# Patient Record
Sex: Female | Born: 1980 | Hispanic: No | Marital: Married | Smoking: Never smoker
Health system: Southern US, Community
[De-identification: ages and names within clinical notes are randomized; demographics above are authoritative.]

## PROBLEM LIST (undated history)

## (undated) DIAGNOSIS — F419 Anxiety disorder, unspecified: Secondary | ICD-10-CM

## (undated) DIAGNOSIS — F32A Depression, unspecified: Secondary | ICD-10-CM

## (undated) DIAGNOSIS — D649 Anemia, unspecified: Secondary | ICD-10-CM

## (undated) DIAGNOSIS — T148XXA Other injury of unspecified body region, initial encounter: Secondary | ICD-10-CM

## (undated) DIAGNOSIS — F329 Major depressive disorder, single episode, unspecified: Secondary | ICD-10-CM

## (undated) HISTORY — PX: HYSTEROSCOPY WITH D & C: SHX1775

## (undated) HISTORY — PX: DIAGNOSTIC LAPAROSCOPY: SUR761

## (undated) HISTORY — PX: INTRAUTERINE DEVICE (IUD) INSERTION: SHX5877

## (undated) HISTORY — PX: DILATION AND CURETTAGE OF UTERUS: SHX78

## (undated) HISTORY — PX: IUD REMOVAL: SHX5392

---

## 2016-10-02 NOTE — H&P (Signed)
35yo G0 who presents for Total laparoscopic hysterectomy, bilateral salpingectomy due to abnormal uterine bleeding.  This has been an ongoing issue for over several years with no improvement despite conservative management.  Part of her issue, is that she has moved several times, so in each city a work up is completed.  Per patient, she was on pills from 2008-2012 and ultimately was switched to a Skyla due to continued irregular bleeding. The Isla Pence was placed about a year ago, initially things were ok up until about April. Additionally, in 2013 she underwent a diagnostic laparoscopy- no abnormal findings. Pt reports inconsistent irregular bleeding daily. Sometimes it will just be spotting, other times, when it seems like it is her menses she will have moderate to heavy bleeding. She usually wears 2-3 pads per day and changes them 2x per day. She has had a hematology work up and everything has been negative. Her bleeding has been unchanged since establishing care with me in October. At this point, she has failed conservative management, does not desire a future pregnancy and wishes to proceed with surgical management of her bleeding.      ROS:  CONSTITUTIONAL:  no Chills. no Fever. no Night sweats.  HEENT:  Blurrred vision no. no Double vision.  CARDIOLOGY:  no Chest pain.  RESPIRATORY:  no Shortness of breath. no Cough.  UROLOGY:  no Urinary frequency. no Urinary incontinence. no Urinary urgency.  GASTROENTEROLOGY:  no Abdominal pain. no Appetite change. no Change in bowel movements.  FEMALE REPRODUCTIVE:  no Breast lumps or discharge. no Breast pain.  NEUROLOGY:  no Dizziness. no Headache. no Loss of consciousness.  PSYCHOLOGY:  Anxiety yes. Depression yes.  SKIN:  no Rash. no Hives.  HEMATOLOGY/LYMPH:  no Anemia. Fatigue yes. Using Blood Thinners no.         Medical History: Mood disorder        Gyn History:  Sexual activity not currently sexually active.  Periods : irregular.   LMP currently.  Birth control Sklya IUD plkaced 06/29/15.  Last pap smear date 2016 - WNL.  Denies H/O Last mammogram date.  Denies H/O Abnormal pap smear.  Denies H/O STD.  Menarche 35.        OB History:  Never been pregnant per patient.        Surgical History: IUD placement, GYN in [place] surgery 2013.        Family History: Father: alive, diabetes, Parkinsons. Mother: deceased, cancer of liver, diabetes. Paternal [place] Father: deceased, diabetes. Paternal [place] Mother: deceased, diabetes. Maternal Grand Father: deceased, diabetes. Maternal Grand Mother: deceased, diabetes. Sister 1: alive. 1 sister(s) . Marland Kitchen        Social History:  General:  Tobacco use  cigarettes: Never smoked Tobacco history last updated 09/17/2016 no EXPOSURE TO PASSIVE SMOKE.  Alcohol: yes, wine, 2 per week for last 10 years as of 07/03/16 OV.  no Recreational drug use.  Marital Status: married.  Children: none.  EDUCATION: Secretary/administrator, Graduate school 2004, 2012.  OCCUPATION: unemployed.  COMMUNICATION BARRIERS: none.  no Smoking.        Medications:  Lamictal(LamoTRIgine) 200 MG Tablet Orally once a day,  BuSpar(BusPIRone HCl) 5 mg 1 tablet Orally three times a day,  Vitamin D 2000 UNIT Tablet 1 tablet Orally Once a day,  Fluticasone Propionate 50 MCG/ACT Suspension 1 spray in each nostril Nasally Once a day, Skyla(Levonorgestrel) 13.5 MG Intrauterine Device Intrauterine ,  Venlafaxine HCl ER 75 MG Capsule Extended Release 24 Hour 1 capsule  with food Orally       Allergies: Floxacins: rash/dizziness: Side Effects, Penicllin: rash: Side Effects, Sulfa: rash: Side Effects, Codeine: heart palpitations: Side Effects.        O: Performed in office: GENERAL APPEARANCE well developed, well nourished .  SKIN: warm and dry, no rashes .  NECK: supple, normal appearance .  LUNGS: regular breathing rate and effort .  HEART: regular rate and rhythm.  ABDOMEN: soft and not tender, no masses palpated, no  rebound, no rigidity.  FEMALE GENITOURINARY: normal external genitalia, labia - unremarkable, vagina - pink moist mucosa, no lesions, ~20cc of dark red blood noted in vault consistent with irregular bleeding, cervix - no discharge or lesions or CMT, strings visualized, adnexa - no masses or tenderness, uterus - nontender and normal size on palpation.  MUSCULOSKELETAL no calf tenderness bilaterally .  EXTREMITIES: no edema present .  PSYCH: appropriate mood and affect .   Labs/Imaging: 10/25 TVUS:  8.8 cm uterus with 2 small fibroids- anterior fundal 4.5cm, submucosal 1.4cm 10/23 EMB: Negative for atypia, hyperplasia or malignancy  A/P: 35yo G0 who presents for TLH, BS due to AUB -NPO -LR @ 125cc/hr -SCDs to OR -Risk/benefit and potential complications reviewed with patient including risk of bleeding, infection, injury and potential need for open surgical management.  Questions and concerns were addressed and pt wishes to proceed.  Janyth Pupa, DO [phone] (pager) [phone] (office)

## 2016-10-04 NOTE — Patient Instructions (Addendum)
Your procedure is scheduled on:  Wednesday, Dec. 27, 2017  Enter through the [employer] [facility] at:  12:15 PM  Pick up the phone at the desk and dial [phone].  Call this number if you have problems the morning of surgery: [phone].  Remember: Do NOT eat food:  After Midnight Tuesday  Do NOT drink clear liquids after:  9:30 AM day of surgery  Take these medicines the morning of surgery with a SIP OF WATER:  Lamictal, Buspar, Aplenzin, Venlafaxine  Stop ALL herbal medications at this time   Do NOT wear jewelry (body piercing), metal hair clips/bobby pins, make-up, or nail polish. Do NOT wear lotions, powders, or perfumes.  You may wear deodorant. Do NOT shave for 48 hours prior to surgery. Do NOT bring valuables to the hospital. Contacts, dentures, or bridgework may not be worn into surgery.  Leave suitcase in car.  After surgery it may be brought to your room.  For patients admitted to the hospital, checkout time is 11:00 AM the day of discharge.

## 2016-10-08 ENCOUNTER — Encounter (HOSPITAL_COMMUNITY)
Admission: RE | Admit: 2016-10-08 | Discharge: 2016-10-08 | Disposition: A | Discharge status: Home / Self Care | Payer: BLUE CROSS/BLUE SHIELD | Source: Ambulatory Visit | Attending: Obstetrics & Gynecology | Admitting: Obstetrics & Gynecology

## 2016-10-08 ENCOUNTER — Encounter (HOSPITAL_COMMUNITY): Payer: Self-pay

## 2016-10-08 DIAGNOSIS — N938 Other specified abnormal uterine and vaginal bleeding: Secondary | ICD-10-CM | POA: Diagnosis not present

## 2016-10-08 DIAGNOSIS — Z880 Allergy status to penicillin: Secondary | ICD-10-CM | POA: Diagnosis not present

## 2016-10-08 DIAGNOSIS — F329 Major depressive disorder, single episode, unspecified: Secondary | ICD-10-CM | POA: Diagnosis not present

## 2016-10-08 DIAGNOSIS — F419 Anxiety disorder, unspecified: Secondary | ICD-10-CM | POA: Diagnosis not present

## 2016-10-08 DIAGNOSIS — D251 Intramural leiomyoma of uterus: Secondary | ICD-10-CM | POA: Diagnosis not present

## 2016-10-08 DIAGNOSIS — Z888 Allergy status to other drugs, medicaments and biological substances status: Secondary | ICD-10-CM | POA: Diagnosis not present

## 2016-10-08 DIAGNOSIS — Z885 Allergy status to narcotic agent status: Secondary | ICD-10-CM | POA: Diagnosis not present

## 2016-10-08 DIAGNOSIS — N800 Endometriosis of uterus: Secondary | ICD-10-CM | POA: Diagnosis not present

## 2016-10-08 DIAGNOSIS — N72 Inflammatory disease of cervix uteri: Secondary | ICD-10-CM | POA: Diagnosis not present

## 2016-10-08 DIAGNOSIS — Z882 Allergy status to sulfonamides status: Secondary | ICD-10-CM | POA: Diagnosis not present

## 2016-10-08 HISTORY — DX: Depression, unspecified: F32.A

## 2016-10-08 HISTORY — DX: Anemia, unspecified: D64.9

## 2016-10-08 HISTORY — DX: Other injury of unspecified body region, initial encounter: T14.8XXA

## 2016-10-08 HISTORY — DX: Major depressive disorder, single episode, unspecified: F32.9

## 2016-10-08 HISTORY — DX: Anxiety disorder, unspecified: F41.9

## 2016-10-08 LAB — CBC
HCT: 39.7 % (ref 36.0–46.0)
Hemoglobin: 13.2 g/dL (ref 12.0–15.0)
MCH: 29.1 pg (ref 26.0–34.0)
MCHC: 33.2 g/dL (ref 30.0–36.0)
MCV: 87.4 fL (ref 78.0–100.0)
PLATELETS: 309 10*3/uL (ref 150–400)
RBC: 4.54 MIL/uL (ref 3.87–5.11)
RDW: 13.8 % (ref 11.5–15.5)
WBC: 6.3 10*3/uL (ref 4.0–10.5)

## 2016-10-08 LAB — BASIC METABOLIC PANEL
Anion gap: 8 (ref 5–15)
BUN: 8 mg/dL (ref 6–20)
CALCIUM: 9 mg/dL (ref 8.9–10.3)
CHLORIDE: 106 mmol/L (ref 101–111)
CO2: 24 mmol/L (ref 22–32)
CREATININE: 0.62 mg/dL (ref 0.44–1.00)
GFR calc non Af Amer: >60 mL/min (ref >60)
Glucose, Bld: 89 mg/dL (ref 65–99)
Potassium: 4.2 mmol/L (ref 3.5–5.1)
SODIUM: 138 mmol/L (ref 135–145)

## 2016-10-08 LAB — TYPE AND SCREEN
ABO/RH(D): AB POS
ANTIBODY SCREEN: NEGATIVE

## 2016-10-08 LAB — ABO/RH: ABO/RH(D): AB POS

## 2016-10-09 ENCOUNTER — Encounter (HOSPITAL_COMMUNITY)
Admission: RE | Disposition: A | Discharge status: Home / Self Care | Payer: Self-pay | Source: Ambulatory Visit | Attending: Obstetrics & Gynecology

## 2016-10-09 ENCOUNTER — Observation Stay (HOSPITAL_COMMUNITY)
Admission: RE | Admit: 2016-10-09 | Discharge: 2016-10-10 | Disposition: A | Discharge status: Home / Self Care | Payer: BLUE CROSS/BLUE SHIELD | Source: Ambulatory Visit | Attending: Obstetrics & Gynecology | Admitting: Obstetrics & Gynecology

## 2016-10-09 ENCOUNTER — Ambulatory Visit (HOSPITAL_COMMUNITY): Payer: BLUE CROSS/BLUE SHIELD | Admitting: Registered Nurse

## 2016-10-09 ENCOUNTER — Encounter (HOSPITAL_COMMUNITY): Payer: Self-pay | Admitting: Emergency Medicine

## 2016-10-09 DIAGNOSIS — N800 Endometriosis of uterus: Secondary | ICD-10-CM | POA: Insufficient documentation

## 2016-10-09 DIAGNOSIS — Z880 Allergy status to penicillin: Secondary | ICD-10-CM | POA: Insufficient documentation

## 2016-10-09 DIAGNOSIS — F419 Anxiety disorder, unspecified: Secondary | ICD-10-CM | POA: Insufficient documentation

## 2016-10-09 DIAGNOSIS — Z888 Allergy status to other drugs, medicaments and biological substances status: Secondary | ICD-10-CM | POA: Insufficient documentation

## 2016-10-09 DIAGNOSIS — N938 Other specified abnormal uterine and vaginal bleeding: Secondary | ICD-10-CM | POA: Diagnosis not present

## 2016-10-09 DIAGNOSIS — D251 Intramural leiomyoma of uterus: Secondary | ICD-10-CM | POA: Insufficient documentation

## 2016-10-09 DIAGNOSIS — Z885 Allergy status to narcotic agent status: Secondary | ICD-10-CM | POA: Insufficient documentation

## 2016-10-09 DIAGNOSIS — F329 Major depressive disorder, single episode, unspecified: Secondary | ICD-10-CM | POA: Insufficient documentation

## 2016-10-09 DIAGNOSIS — Z882 Allergy status to sulfonamides status: Secondary | ICD-10-CM | POA: Insufficient documentation

## 2016-10-09 DIAGNOSIS — N72 Inflammatory disease of cervix uteri: Secondary | ICD-10-CM | POA: Insufficient documentation

## 2016-10-09 DIAGNOSIS — N939 Abnormal uterine and vaginal bleeding, unspecified: Secondary | ICD-10-CM | POA: Diagnosis present

## 2016-10-09 LAB — PREGNANCY, URINE: PREG TEST UR: NEGATIVE

## 2016-10-09 SURGERY — HYSTERECTOMY, TOTAL, LAPAROSCOPIC, WITH SALPINGECTOMY
Anesthesia: General | Laterality: Bilateral

## 2016-10-09 MED ORDER — GENTAMICIN SULFATE 40 MG/ML IJ SOLN
INTRAMUSCULAR | Status: AC
  Administered 2016-10-09: 100 mL via INTRAVENOUS
  Filled 2016-10-09: qty 7.25

## 2016-10-09 MED ORDER — ONDANSETRON HCL 4 MG/2ML IJ SOLN
INTRAMUSCULAR | Status: DC | PRN
  Administered 2016-10-09: 4 mg via INTRAVENOUS

## 2016-10-09 MED ORDER — SCOPOLAMINE 1 MG/3DAYS TD PT72
MEDICATED_PATCH | TRANSDERMAL | Status: AC
  Administered 2016-10-09: 1.5 mg via TRANSDERMAL
  Filled 2016-10-09: qty 1

## 2016-10-09 MED ORDER — DEXAMETHASONE SODIUM PHOSPHATE 4 MG/ML IJ SOLN
INTRAMUSCULAR | Status: AC
  Filled 2016-10-09: qty 1

## 2016-10-09 MED ORDER — ALUM & MAG HYDROXIDE-SIMETH 200-200-20 MG/5ML PO SUSP: 30.0000 mL | ORAL | Status: DC | PRN

## 2016-10-09 MED ORDER — PROPOFOL 10 MG/ML IV BOLUS
INTRAVENOUS | Status: DC | PRN
  Administered 2016-10-09: 120 mg via INTRAVENOUS

## 2016-10-09 MED ORDER — BUPROPION HCL ER (XL) 150 MG PO TB24
150.0000 mg | ORAL_TABLET | Freq: Every day | ORAL | Status: DC
  Filled 2016-10-09: qty 1

## 2016-10-09 MED ORDER — MENTHOL 3 MG MT LOZG: 1.0000 | LOZENGE | OROMUCOSAL | Status: DC | PRN

## 2016-10-09 MED ORDER — MIDAZOLAM HCL 2 MG/2ML IJ SOLN
INTRAMUSCULAR | Status: DC | PRN
  Administered 2016-10-09: 2 mg via INTRAVENOUS

## 2016-10-09 MED ORDER — PROMETHAZINE HCL 25 MG/ML IJ SOLN: 6.2500 mg | INTRAMUSCULAR | Status: DC | PRN

## 2016-10-09 MED ORDER — PHENYLEPHRINE HCL 10 MG/ML IJ SOLN
INTRAMUSCULAR | Status: DC | PRN
  Administered 2016-10-09 (×3): 40 ug via INTRAVENOUS
  Administered 2016-10-09: 80 ug via INTRAVENOUS
  Administered 2016-10-09: 40 ug via INTRAVENOUS

## 2016-10-09 MED ORDER — HYDROMORPHONE HCL 1 MG/ML IJ SOLN: 0.2500 mg | INTRAMUSCULAR | Status: DC | PRN

## 2016-10-09 MED ORDER — LIDOCAINE HCL (CARDIAC) 20 MG/ML IV SOLN
INTRAVENOUS | Status: AC
  Filled 2016-10-09: qty 5

## 2016-10-09 MED ORDER — KETOROLAC TROMETHAMINE 30 MG/ML IJ SOLN
INTRAMUSCULAR | Status: DC | PRN
  Administered 2016-10-09: 30 mg via INTRAVENOUS

## 2016-10-09 MED ORDER — CEFAZOLIN SODIUM-DEXTROSE 2-4 GM/100ML-% IV SOLN: 2.0000 g | INTRAVENOUS | Status: DC

## 2016-10-09 MED ORDER — ONDANSETRON HCL 4 MG/2ML IJ SOLN
INTRAMUSCULAR | Status: AC
  Filled 2016-10-09: qty 2

## 2016-10-09 MED ORDER — LIDOCAINE HCL (CARDIAC) 20 MG/ML IV SOLN
INTRAVENOUS | Status: DC | PRN
  Administered 2016-10-09: 50 mg via INTRAVENOUS

## 2016-10-09 MED ORDER — KETOROLAC TROMETHAMINE 30 MG/ML IJ SOLN: 30.0000 mg | Freq: Four times a day (QID) | INTRAMUSCULAR | Status: DC

## 2016-10-09 MED ORDER — ROCURONIUM BROMIDE 100 MG/10ML IV SOLN
INTRAVENOUS | Status: DC | PRN
  Administered 2016-10-09: 40 mg via INTRAVENOUS

## 2016-10-09 MED ORDER — MIDAZOLAM HCL 2 MG/2ML IJ SOLN: 0.5000 mg | Freq: Once | INTRAMUSCULAR | Status: DC | PRN

## 2016-10-09 MED ORDER — FENTANYL CITRATE (PF) 100 MCG/2ML IJ SOLN
INTRAMUSCULAR | Status: DC | PRN
  Administered 2016-10-09 (×3): 50 ug via INTRAVENOUS
  Administered 2016-10-09: 100 ug via INTRAVENOUS

## 2016-10-09 MED ORDER — LACTATED RINGERS IV SOLN
INTRAVENOUS | Status: DC
  Administered 2016-10-09 – 2016-10-10 (×2): 125 mL/h via INTRAVENOUS

## 2016-10-09 MED ORDER — ONDANSETRON HCL 4 MG PO TABS: 4.0000 mg | ORAL_TABLET | Freq: Four times a day (QID) | ORAL | Status: DC | PRN

## 2016-10-09 MED ORDER — DEXAMETHASONE SODIUM PHOSPHATE 10 MG/ML IJ SOLN
INTRAMUSCULAR | Status: DC | PRN
  Administered 2016-10-09: 10 mg via INTRAVENOUS

## 2016-10-09 MED ORDER — IBUPROFEN 600 MG PO TABS: 600.0000 mg | ORAL_TABLET | Freq: Four times a day (QID) | ORAL | Status: DC | PRN

## 2016-10-09 MED ORDER — DOCUSATE SODIUM 100 MG PO CAPS
100.0000 mg | ORAL_CAPSULE | Freq: Two times a day (BID) | ORAL | Status: DC
  Administered 2016-10-09: 100 mg via ORAL
  Filled 2016-10-09: qty 1

## 2016-10-09 MED ORDER — FENTANYL CITRATE (PF) 250 MCG/5ML IJ SOLN
INTRAMUSCULAR | Status: AC
  Filled 2016-10-09: qty 5

## 2016-10-09 MED ORDER — LACTATED RINGERS IV SOLN: INTRAVENOUS | Status: DC

## 2016-10-09 MED ORDER — SUGAMMADEX SODIUM 200 MG/2ML IV SOLN
INTRAVENOUS | Status: DC | PRN
  Administered 2016-10-09: 122.6 mg via INTRAVENOUS

## 2016-10-09 MED ORDER — BUPIVACAINE HCL (PF) 0.25 % IJ SOLN
INTRAMUSCULAR | Status: AC
  Filled 2016-10-09: qty 30

## 2016-10-09 MED ORDER — MIDAZOLAM HCL 2 MG/2ML IJ SOLN
INTRAMUSCULAR | Status: AC
  Filled 2016-10-09: qty 2

## 2016-10-09 MED ORDER — BUPROPION HBR ER 174 MG PO TB24: 1.0000 | ORAL_TABLET | Freq: Every day | ORAL | Status: DC

## 2016-10-09 MED ORDER — BUPIVACAINE HCL (PF) 0.25 % IJ SOLN
INTRAMUSCULAR | Status: DC | PRN
  Administered 2016-10-09: 30 mL

## 2016-10-09 MED ORDER — LAMOTRIGINE 200 MG PO TABS
200.0000 mg | ORAL_TABLET | Freq: Every day | ORAL | Status: DC
  Filled 2016-10-09: qty 1

## 2016-10-09 MED ORDER — ONDANSETRON HCL 4 MG/2ML IJ SOLN: 4.0000 mg | Freq: Four times a day (QID) | INTRAMUSCULAR | Status: DC | PRN

## 2016-10-09 MED ORDER — BUSPIRONE HCL 5 MG PO TABS
5.0000 mg | ORAL_TABLET | Freq: Three times a day (TID) | ORAL | Status: DC
  Administered 2016-10-09: 5 mg via ORAL
  Filled 2016-10-09 (×2): qty 1

## 2016-10-09 MED ORDER — SCOPOLAMINE 1 MG/3DAYS TD PT72
1.0000 | MEDICATED_PATCH | Freq: Once | TRANSDERMAL | Status: DC
  Administered 2016-10-09: 1.5 mg via TRANSDERMAL

## 2016-10-09 MED ORDER — EPHEDRINE SULFATE 50 MG/ML IJ SOLN
INTRAMUSCULAR | Status: DC | PRN
  Administered 2016-10-09 (×2): 5 mg via INTRAVENOUS

## 2016-10-09 MED ORDER — ROCURONIUM BROMIDE 100 MG/10ML IV SOLN
INTRAVENOUS | Status: AC
Start: 2016-10-09 — End: 2016-10-09
  Filled 2016-10-09: qty 1

## 2016-10-09 MED ORDER — KETOROLAC TROMETHAMINE 30 MG/ML IJ SOLN
INTRAMUSCULAR | Status: AC
  Filled 2016-10-09: qty 1

## 2016-10-09 MED ORDER — KETOROLAC TROMETHAMINE 30 MG/ML IJ SOLN
30.0000 mg | Freq: Four times a day (QID) | INTRAMUSCULAR | Status: DC
  Administered 2016-10-09 – 2016-10-10 (×2): 30 mg via INTRAVENOUS
  Filled 2016-10-09 (×2): qty 1

## 2016-10-09 MED ORDER — SIMETHICONE 80 MG PO CHEW: 80.0000 mg | CHEWABLE_TABLET | Freq: Four times a day (QID) | ORAL | Status: DC | PRN

## 2016-10-09 MED ORDER — VENLAFAXINE HCL 37.5 MG PO TABS
37.5000 mg | ORAL_TABLET | Freq: Two times a day (BID) | ORAL | Status: DC
  Filled 2016-10-09 (×2): qty 1

## 2016-10-09 MED ORDER — MEPERIDINE HCL 25 MG/ML IJ SOLN: 6.2500 mg | INTRAMUSCULAR | Status: DC | PRN

## 2016-10-09 MED ORDER — PROPOFOL 10 MG/ML IV BOLUS
INTRAVENOUS | Status: AC
Start: 2016-10-09 — End: 2016-10-09
  Filled 2016-10-09: qty 20

## 2016-10-09 MED ORDER — LACTATED RINGERS IV SOLN
INTRAVENOUS | Status: DC
  Administered 2016-10-09 (×3): via INTRAVENOUS

## 2016-10-09 MED ORDER — LACTATED RINGERS IR SOLN
Status: DC | PRN
  Administered 2016-10-09: 3000 mL

## 2016-10-09 SURGICAL SUPPLY — 46 items
CABLE HIGH FREQUENCY MONO STRZ (ELECTRODE) ×3 IMPLANT
CLOTH BEACON ORANGE TIMEOUT ST (SAFETY) ×3 IMPLANT
DERMABOND ADHESIVE PROPEN (GAUZE/BANDAGES/DRESSINGS) ×2
DERMABOND ADVANCED (GAUZE/BANDAGES/DRESSINGS) ×2
DERMABOND ADVANCED .7 DNX12 (GAUZE/BANDAGES/DRESSINGS) ×1 IMPLANT
DERMABOND ADVANCED .7 DNX6 (GAUZE/BANDAGES/DRESSINGS) ×1 IMPLANT
DRSG OPSITE POSTOP 3X4 (GAUZE/BANDAGES/DRESSINGS) ×3 IMPLANT
DURAPREP 26ML APPLICATOR (WOUND CARE) ×3 IMPLANT
FILTER SMOKE EVAC LAPAROSHD (FILTER) ×3 IMPLANT
GLOVE BIOGEL PI IND STRL 6.5 (GLOVE) ×2 IMPLANT
GLOVE BIOGEL PI IND STRL 7.0 (GLOVE) ×4 IMPLANT
GLOVE BIOGEL PI INDICATOR 6.5 (GLOVE) ×4
GLOVE BIOGEL PI INDICATOR 7.0 (GLOVE) ×8
GLOVE ECLIPSE 6.5 STRL STRAW (GLOVE) ×9 IMPLANT
LEGGING LITHOTOMY PAIR STRL (DRAPES) ×3 IMPLANT
LIGASURE VESSEL 5MM BLUNT TIP (ELECTROSURGICAL) IMPLANT
NEEDLE INSUFFLATION 120MM (ENDOMECHANICALS) ×3 IMPLANT
OCCLUDER COLPOPNEUMO (BALLOONS) ×3 IMPLANT
PACK LAVH (CUSTOM PROCEDURE TRAY) ×3 IMPLANT
PACK ROBOTIC GOWN (GOWN DISPOSABLE) ×3 IMPLANT
PACK TRENDGUARD 450 HYBRID PRO (MISCELLANEOUS) ×1 IMPLANT
PACK TRENDGUARD 600 HYBRD PROC (MISCELLANEOUS) IMPLANT
PAD OB MATERNITY 4.3X12.25 (PERSONAL CARE ITEMS) ×3 IMPLANT
PROTECTOR NERVE ULNAR (MISCELLANEOUS) ×6 IMPLANT
SET IRRIG TUBING LAPAROSCOPIC (IRRIGATION / IRRIGATOR) ×3 IMPLANT
SHEARS HARMONIC ACE PLUS 36CM (ENDOMECHANICALS) ×3 IMPLANT
SLEEVE XCEL OPT CAN 5 100 (ENDOMECHANICALS) ×6 IMPLANT
SUT MON AB 4-0 PS1 27 (SUTURE) ×3 IMPLANT
SUT VIC AB 0 CT1 18XCR BRD8 (SUTURE) ×1 IMPLANT
SUT VIC AB 0 CT1 27 (SUTURE) ×4
SUT VIC AB 0 CT1 27XBRD ANBCTR (SUTURE) ×2 IMPLANT
SUT VIC AB 0 CT1 36 (SUTURE) ×6 IMPLANT
SUT VIC AB 0 CT1 8-18 (SUTURE) ×2
SUT VICRYL 0 UR6 27IN ABS (SUTURE) IMPLANT
TIP UTERINE 5.1X6CM LAV DISP (MISCELLANEOUS) IMPLANT
TIP UTERINE 6.7X10CM GRN DISP (MISCELLANEOUS) ×3 IMPLANT
TIP UTERINE 6.7X6CM WHT DISP (MISCELLANEOUS) IMPLANT
TIP UTERINE 6.7X8CM BLUE DISP (MISCELLANEOUS) IMPLANT
TOWEL OR 17X24 6PK STRL BLUE (TOWEL DISPOSABLE) ×6 IMPLANT
TRAY FOLEY CATH SILVER 14FR (SET/KITS/TRAYS/PACK) ×3 IMPLANT
TRENDGUARD 450 HYBRID PRO PACK (MISCELLANEOUS) ×3
TRENDGUARD 600 HYBRID PROC PK (MISCELLANEOUS)
TROCAR XCEL NON-BLD 11X100MML (ENDOMECHANICALS) IMPLANT
TROCAR XCEL NON-BLD 5MMX100MML (ENDOMECHANICALS) ×3 IMPLANT
WARMER LAPAROSCOPE (MISCELLANEOUS) ×3 IMPLANT
WATER STERILE IRR 1000ML POUR (IV SOLUTION) ×3 IMPLANT

## 2016-10-09 NOTE — Interval H&P Note (Signed)
History and Physical Interval Note:  10/09/2016 1:01 PM  Wanda Lee  has presented today for surgery, with the diagnosis of N93.9 Abnormal Uterine Bleeding  The various methods of treatment have been discussed with the patient and family. After consideration of risks, benefits and other options for treatment, the patient has consented to  Procedure(s): TOTAL LAPAROSCOPIC HYSTERECTOMY WITH SALPINGECTOMY (Bilateral) as a surgical intervention .  The patient's history has been reviewed, patient examined, no change in status, stable for surgery.  I have reviewed the patient's chart and labs.  Questions were answered to the patient's satisfaction.     Janyth Pupa, M

## 2016-10-09 NOTE — Anesthesia Postprocedure Evaluation (Signed)
Anesthesia Post Note  Patient: Therapist, art  Procedure(s) Performed: Procedure(s) (LRB): TOTAL LAPAROSCOPIC HYSTERECTOMY WITH BILATERAL SALPINGECTOMY (Bilateral)  Patient location during evaluation: PACU Anesthesia Type: General Level of consciousness: awake and alert Pain management: pain level controlled Vital Signs Assessment: post-procedure vital signs reviewed and stable Respiratory status: spontaneous breathing, nonlabored ventilation, respiratory function stable and patient connected to nasal cannula oxygen Cardiovascular status: blood pressure returned to baseline and stable Postop Assessment: no signs of nausea or vomiting Anesthetic complications: no        Last Vitals:  Vitals:   10/09/16 1715 10/09/16 1724  BP: 109/73 106/67  Pulse: 98 96  Resp: 17 16  Temp: 36.8 C 36.6 C    Last Pain:  Vitals:   10/09/16 1715  TempSrc:   PainSc: 0-No pain   Pain Goal: Patients Stated Pain Goal: 4 (10/09/16 1715)               Catalina Gravel

## 2016-10-09 NOTE — Op Note (Signed)
OPERATIVE NOTE  Wanda Lee  DOB:    1981-04-24  MRN:    [id number]  CSN:    [id number]  Date of Surgery:  10/09/2016  Preoperative Diagnosis:  Abnormal uterine bleeding, Uterine fibroids  Postoperative Diagnosis: same  Procedure: Total laparoscopic hysterectomy, Bilateral salpingectomy  Surgeon: Dr. Janyth Pupa  Assistant:  Dr. Cyndia Skeeters  Anesthetic: general   Findings: 10cm enlarged uterus with 4cm fundal fibroids.  Normal ovaries and fallopian tubes bilaterally.  EBL: 50cc UOP: 500cc IVF: 2000cc  Procedure:  The patient was taken to the operating room where a general anesthetic was given. The patient's  abdomen was prepped with ChloraPrep. The perineum and vagina were prepped with multiple layers of Betadine. An examination under anesthesia was performed. A RUMI manipulator was placed inside the uterus. The patient was sterilely draped.  A Foley catheter was placed in the bladder.   The supraumbilical area was injected with quarter percent Marcaine. An incision was made and the Veress needle was inserted into the abdominal cavity without difficulty. Proper placement was confirmed using the saline drop test. A pneumoperitoneum was obtained. The laparoscopic trocar and the laparoscope were substituted for the Veress needle and placement was confirmed. Operative findings were as mentioned above. Attention was turned to the LLQ, injected with locals and a 40mm incision was made and trocar inserted.  An identical procedure was carried out on the opposite side. Pictures were taken of the patient's pelvic structures. The right ureter was identified.  The right round ligament was cauterized using the Harmonic. The right fallopian tube was identified and followed to its fimbriated end. The proximal portion of the right fallopian tube was cauterized and cut. The right utero-ovarian ligament was cauterized and cut. The bladder flap was developed anteriorly.  Attention was  turned to the left side and in a similar fashion the ureter was identified.  The left round ligament was cauterized and cut using the Harmonic. The left fallopian tube, left upper pedicle, and the left utero-ovarian ligament were cauterized and cut as we did on the opposite side.  The left uterine artery was skeletonized.  The left uterine artery was then ligated with the Kleppinger.  In a similar fashion, the right uterine artery was ligated.  Using the harmonic, the cuff was scored and the RUMI manipulator was visualized.  Using the hook, completion of the colpotomy was performed.   Attention was turned vaginally and the uterus and bilateral fallopian tubes were removed.  Angle stitches were placed.  The vaginal cuff was then closed in a running fashion using 0 vicryl.  Hemostasis noted.   Gown and gloves were changed. The pneumoperitoneum was reestablished. The pelvis was inspected and hemostasis was adequate. The pelvis was irrigated and again hemostasis was confirmed.  The 5 mm trocars were removed under direct visualization. The pneumoperitoneum was allowed to escape. The skin incisions were closed with dermabond.  The patient tolerated the procedure well. She was awakened from her anesthetic without difficulty and then transported to the recovery room in stable condition. Sponge, needle, and instrument counts were correct.  Janyth Pupa, DO [phone] (pager) [phone] (office)

## 2016-10-09 NOTE — Anesthesia Procedure Notes (Signed)
Procedure Name: Intubation Date/Time: 10/09/2016 1:40 PM Performed by: Hewitt Blade Pre-anesthesia Checklist: Patient identified, Emergency Drugs available, Suction available and Patient being monitored Patient Re-evaluated:Patient Re-evaluated prior to inductionOxygen Delivery Method: Circle system utilized Preoxygenation: Pre-oxygenation with 100% oxygen Intubation Type: IV induction Ventilation: Mask ventilation without difficulty Laryngoscope Size: Mac and 3 Grade View: Grade I Tube type: Oral Tube size: 7.0 mm Number of attempts: 1 Airway Equipment and Method: Stylet Placement Confirmation: ETT inserted through vocal cords under direct vision,  positive ETCO2 and breath sounds checked- equal and bilateral Secured at: 21 cm Tube secured with: Tape Dental Injury: Teeth and Oropharynx as per pre-operative assessment

## 2016-10-09 NOTE — Anesthesia Preprocedure Evaluation (Addendum)
Anesthesia Evaluation  Patient identified by MRN, date of birth, ID band Patient awake    Reviewed: Allergy & Precautions, NPO status , Patient's Chart, lab work & pertinent test results  History of Anesthesia Complications Negative for: history of anesthetic complications  Airway Mallampati: I  TM Distance: >3 FB Neck ROM: Full    Dental  (+) Dental Advisory Given   Pulmonary neg pulmonary ROS,    breath sounds clear to auscultation       Cardiovascular negative cardio ROS   Rhythm:Regular Rate:Normal     Neuro/Psych Anxiety Depression negative neurological ROS     GI/Hepatic negative GI ROS, Neg liver ROS,   Endo/Other  negative endocrine ROS  Renal/GU negative Renal ROS     Musculoskeletal   Abdominal   Peds  Hematology negative hematology ROS (+)   Anesthesia Other Findings   Reproductive/Obstetrics                            Anesthesia Physical Anesthesia Plan  ASA: II  Anesthesia Plan: General   Post-op Pain Management:    Induction: Intravenous  Airway Management Planned: Oral ETT  Additional Equipment:   Intra-op Plan:   Post-operative Plan: Extubation in OR  Informed Consent: I have reviewed the patients History and Physical, chart, labs and discussed the procedure including the risks, benefits and alternatives for the proposed anesthesia with the patient or authorized representative who has indicated his/her understanding and acceptance.   Dental advisory given  Plan Discussed with: CRNA and Surgeon  Anesthesia Plan Comments: (Plan routine monitors, GETA)        Anesthesia Quick Evaluation

## 2016-10-09 NOTE — Transfer of Care (Signed)
Immediate Anesthesia Transfer of Care Note  Patient: Wanda Lee  Procedure(s) Performed: Procedure(s): TOTAL LAPAROSCOPIC HYSTERECTOMY WITH BILATERAL SALPINGECTOMY (Bilateral)  Patient Location: PACU  Anesthesia Type:General  Level of Consciousness: awake, alert  and oriented  Airway & Oxygen Therapy: Patient Spontanous Breathing and Patient connected to nasal cannula oxygen  Post-op Assessment: Report given to RN, Post -op Vital signs reviewed and stable and Patient moving all extremities  Post vital signs: Reviewed and stable  Last Vitals:  Vitals:   10/09/16 1223  BP: 118/85  Pulse: 96  Resp: 16  Temp: 36.8 C    Last Pain:  Vitals:   10/09/16 1223  TempSrc: Oral      Patients Stated Pain Goal: 4 ([id number])  Complications: No apparent anesthesia complications

## 2016-10-10 DIAGNOSIS — N938 Other specified abnormal uterine and vaginal bleeding: Secondary | ICD-10-CM | POA: Diagnosis not present

## 2016-10-10 LAB — CBC
HEMATOCRIT: 34.1 % — AB (ref 36.0–46.0)
Hemoglobin: 11.6 g/dL — ABNORMAL LOW (ref 12.0–15.0)
MCH: 29.4 pg (ref 26.0–34.0)
MCHC: 34 g/dL (ref 30.0–36.0)
MCV: 86.3 fL (ref 78.0–100.0)
Platelets: 294 10*3/uL (ref 150–400)
RBC: 3.95 MIL/uL (ref 3.87–5.11)
RDW: 13.6 % (ref 11.5–15.5)
WBC: 11.2 10*3/uL — AB (ref 4.0–10.5)

## 2016-10-10 MED ORDER — OXYCODONE-ACETAMINOPHEN 5-325 MG PO TABS: 1.0000 | ORAL_TABLET | Freq: Four times a day (QID) | ORAL | 0 refills | Status: DC | PRN

## 2016-10-10 MED ORDER — OXYCODONE-ACETAMINOPHEN 5-325 MG PO TABS: 1.0000 | ORAL_TABLET | Freq: Four times a day (QID) | ORAL | Status: DC | PRN

## 2016-10-10 MED ORDER — IBUPROFEN 600 MG PO TABS: 600.0000 mg | ORAL_TABLET | Freq: Four times a day (QID) | ORAL | 0 refills | Status: AC | PRN

## 2016-10-10 MED ORDER — DOCUSATE SODIUM 100 MG PO CAPS: 100.0000 mg | ORAL_CAPSULE | Freq: Two times a day (BID) | ORAL | 0 refills | Status: AC

## 2016-10-10 NOTE — Progress Notes (Signed)
Discharge teaching complete. Pt understood all instructions and did not have any questions. Pt ambulated out of the hospital and discharged home to family.  

## 2016-10-10 NOTE — Progress Notes (Signed)
Postoperative Note Day # 1  S:  Patient resting comfortable in bed.  Pain controlled.  Tolerating general diet. + flatus, no BM.  Reports light spotting.  Ambulating without difficulty.  She denies n/v/f/c, SOB, or CP. Overall doing well and reports no acute complaints.  O: Temp:  [97.8 F (36.6 C)-99.7 F (37.6 C)] 97.9 F (36.6 C) (12/28 0337) Pulse Rate:  [70-119] 70 (12/28 0337) Resp:  [10-18] 16 (12/28 0337) BP: (89-118)/(48-85) 89/48 (12/28 0337) SpO2:  [96 %-100 %] 99 % (12/28 0337) Weight:  [135 lb 4 oz (61.3 kg)] 135 lb 4 oz (61.3 kg) (12/27 1730) Gen: A&Ox3, NAD CV: RRR, no MRG Resp: CTAB Abdomen: soft, NT, ND +BS Incision: C/D/I with dermabond Ext: No edema, no calf tenderness bilaterally  Labs:  Recent Labs  10/08/16 1025 10/10/16 0623  HGB 13.2 11.6*    A/P: Pt is a 35 y.o. G0 s/p TLH, BS, POD#1  - Pain well controlled, will transition to oral pain medication only -GU: UOP adequate, foley removed this am -GI: Tolerating general diet -Activity: encouraged sitting up to chair and ambulation as tolerated -Prophylaxis: early ambulation, SCDs while in bed -Labs: stable as above  DISPO: Once pt able to void this am, will plan for discharge home later today.  Janyth Pupa, DO [phone] (pager) [phone] (office)

## 2016-10-10 NOTE — Discharge Instructions (Signed)
Total Laparoscopic Hysterectomy, Care After Refer to this sheet in the next few weeks. These instructions provide you with information on caring for yourself after your procedure. Your health care provider may also give you more specific instructions. Your treatment has been planned according to current medical practices, but problems sometimes occur. Call your health care provider if you have any problems or questions after your procedure. What can I expect after the procedure?  Pain and bruising at the incision sites. You will be given pain medicine to control it.  Sore throat from the breathing tube that was inserted during surgery. Follow these instructions at home:  Only take over-the-counter or prescription medicines for pain, discomfort, or fever as directed by your health care provider.  For pain you may alternate between Percocet and Ibuprofen for pain.  Percocet may cause constipation, so please continue with the stool softener twice daily as needed.  Do not take aspirin. It can cause bleeding.  Do not drive when taking pain medicine.  Follow your health care provider's advice regarding diet, exercise, lifting, driving, and general activities.  Resume your usual diet as directed and allowed.  Get plenty of rest and sleep.  Do not douche, use tampons, or have sexual intercourse for at least 6 weeks, or until your health care provider gives you permission.  Change your bandages (dressings) as directed by your health care provider.  Monitor your temperature and notify your health care provider of a fever.  Take showers instead of baths for 2-3 weeks.  Do not drink alcohol until your health care provider gives you permission.  If you develop constipation, you may take a mild laxative with your health care provider's permission. Bran foods may help with constipation problems. Drinking enough fluids to keep your urine clear or pale yellow may help as well.  Try to have someone  home with you for 1-2 weeks to help around the house.  Keep all of your follow-up appointments as directed by your health care provider. Contact a health care provider if:  You have swelling, redness, or increasing pain around your incision sites.  You have pus coming from your incision.  You notice a bad smell coming from your incision.  Your incision breaks open.  You feel dizzy or lightheaded.  You have pain or bleeding when you urinate.  You have persistent diarrhea.  You have persistent nausea and vomiting.  You have abnormal vaginal discharge.  You have a rash.  You have any type of abnormal reaction or develop an allergy to your medicine.  You have poor pain control with your prescribed medicine. Get help right away if:  You have chest pain or shortness of breath.  You have severe abdominal pain that is not relieved with pain medicine.  You have pain or swelling in your legs. This information is not intended to replace advice given to you by your health care provider. Make sure you discuss any questions you have with your health care provider. Document Released: 07/21/2013 Document Revised: 03/07/2016 Document Reviewed: 04/20/2013 Elsevier Interactive Patient Education  2017 [employer].

## 2016-10-10 NOTE — Anesthesia Postprocedure Evaluation (Signed)
Anesthesia Post Note  Patient: Therapist, art  Procedure(s) Performed: Procedure(s) (LRB): TOTAL LAPAROSCOPIC HYSTERECTOMY WITH BILATERAL SALPINGECTOMY (Bilateral)  Patient location during evaluation: [facility] Anesthesia Type: General Pain management: pain level controlled Vital Signs Assessment: post-procedure vital signs reviewed and stable Respiratory status: spontaneous breathing Cardiovascular status: blood pressure returned to baseline Anesthetic complications: no        Last Vitals:  Vitals:   10/10/16 0337 10/10/16 0735  BP: (!) 89/48 102/64  Pulse: 70 68  Resp: 16 16  Temp: 36.6 C 37.1 C    Last Pain:  Vitals:   10/10/16 0735  TempSrc: Oral  PainSc:    Pain Goal: Patients Stated Pain Goal: 4 (10/10/16 0730)               Ailene Ards

## 2016-10-10 NOTE — Addendum Note (Signed)
Addendum  created 10/10/16 0748 by Genevie Ann, CRNA   Sign clinical note

## 2016-10-16 NOTE — Discharge Summary (Signed)
Physician Discharge Summary  Patient ID: Wanda Lee MRN: [id number] DOB/AGE: [birth year] 36 y.o.  Admit date: 10/09/2016 Discharge date: 10/10/2016  Admission Diagnoses:Abnormal uterine bleeding  Discharge Diagnoses:  Active Problems:   Abnormal uterine bleeding   Discharged Condition: stable  Hospital Course: 36yo G0 who presents for surgical management of her AUB.  Pt underwent a Total laparoscopic hysterectomy and bilateral salpingectomy on 12/27.  For information regarding the procedure, please see the operative note.  Her postop care was uncomplicated.  She met milestones appropriately and was discharged home in stable condition on POD #1.  Consults: None  Significant Diagnostic Studies: labs:  CBC Latest Ref Rng & Units 10/10/2016 10/08/2016  WBC 4.0 - 10.5 K/uL 11.2(H) 6.3  Hemoglobin 12.0 - 15.0 g/dL 11.6(L) 13.2  Hematocrit 36.0 - 46.0 % 34.1(L) 39.7  Platelets 150 - 400 K/uL 294 309     Treatments: IV hydration, antibiotics: Ancef and surgery: TLH, BS  Discharge Exam: Blood pressure 102/64, pulse 68, temperature 98.7 F (37.1 C), temperature source Oral, resp. rate 16, height '5\' 4"'  (1.626 m), weight 135 lb 4 oz (61.3 kg), last menstrual period 08/30/2016, SpO2 100 %.  Gen: A&Ox3, NAD CV: RRR, no MRG Resp: CTAB Abdomen: soft, NT, ND +BS Incision: C/D/I with dermabond Ext: No edema, no calf tenderness bilaterally   Disposition: 01-Home or Self Care   Allergies as of 10/10/2016      Reactions   Codeine    Palpitations, hot flashes, and dizziness   Floxin [ofloxacin] Hives   Vicodin [hydrocodone-acetaminophen]    Palpitations, hot flashes, and dizziness   Penicillins Rash   Has patient had a PCN reaction causing immediate rash, facial/tongue/throat swelling, SOB or lightheadedness with hypotension: Yes Has patient had a PCN reaction causing severe rash involving mucus membranes or skin necrosis: Yes Has patient had a PCN reaction that required  hospitalization No Has patient had a PCN reaction occurring within the last 10 years: No If all of the above answers are "NO", then may proceed with Cephalosporin use.   Sulfa Antibiotics Rash      Medication List    TAKE these medications   APLENZIN 174 MG Tb24 Generic drug:  BuPROPion HBr Take 1 tablet by mouth daily.   busPIRone 5 MG tablet Commonly known as:  BUSPAR Take 5 mg by mouth 3 (three) times daily.   cholecalciferol 1000 units tablet Commonly known as:  VITAMIN D Take 1,000 Units by mouth daily.   docusate sodium 100 MG capsule Commonly known as:  COLACE Take 1 capsule (100 mg total) by mouth 2 (two) times daily.   ibuprofen 600 MG tablet Commonly known as:  ADVIL,MOTRIN Take 1 tablet (600 mg total) by mouth every 6 (six) hours as needed (mild pain).   lamoTRIgine 200 MG tablet Commonly known as:  LAMICTAL Take 200 mg by mouth daily.   oxyCODONE-acetaminophen 5-325 MG tablet Commonly known as:  PERCOCET/ROXICET Take 1 tablet by mouth every 6 (six) hours as needed for moderate pain.   venlafaxine 37.5 MG tablet Commonly known as:  EFFEXOR Take 37.5 mg by mouth 2 (two) times daily.      Follow-up Information    Janyth Pupa, M, DO Follow up in 2 week(s).   Specialty:  Obstetrics and Gynecology Contact information: [address] [place] [address] [phone]           Signed: Annalee Genta 10/16/2016, 11:11 AM

## 2017-08-20 DIAGNOSIS — F4323 Adjustment disorder with mixed anxiety and depressed mood: Secondary | ICD-10-CM | POA: Diagnosis not present

## 2017-09-01 DIAGNOSIS — F4323 Adjustment disorder with mixed anxiety and depressed mood: Secondary | ICD-10-CM | POA: Diagnosis not present

## 2017-09-02 DIAGNOSIS — F4323 Adjustment disorder with mixed anxiety and depressed mood: Secondary | ICD-10-CM | POA: Diagnosis not present

## 2017-10-16 DIAGNOSIS — F4323 Adjustment disorder with mixed anxiety and depressed mood: Secondary | ICD-10-CM | POA: Diagnosis not present

## 2017-10-21 DIAGNOSIS — F4323 Adjustment disorder with mixed anxiety and depressed mood: Secondary | ICD-10-CM | POA: Diagnosis not present

## 2017-10-28 DIAGNOSIS — F4323 Adjustment disorder with mixed anxiety and depressed mood: Secondary | ICD-10-CM | POA: Diagnosis not present

## 2017-11-04 DIAGNOSIS — F4323 Adjustment disorder with mixed anxiety and depressed mood: Secondary | ICD-10-CM | POA: Diagnosis not present

## 2017-11-10 DIAGNOSIS — F419 Anxiety disorder, unspecified: Secondary | ICD-10-CM | POA: Diagnosis not present

## 2017-11-10 DIAGNOSIS — F39 Unspecified mood [affective] disorder: Secondary | ICD-10-CM | POA: Diagnosis not present

## 2017-11-11 DIAGNOSIS — F4323 Adjustment disorder with mixed anxiety and depressed mood: Secondary | ICD-10-CM | POA: Diagnosis not present

## 2017-11-18 DIAGNOSIS — F4323 Adjustment disorder with mixed anxiety and depressed mood: Secondary | ICD-10-CM | POA: Diagnosis not present

## 2017-11-25 DIAGNOSIS — F4323 Adjustment disorder with mixed anxiety and depressed mood: Secondary | ICD-10-CM | POA: Diagnosis not present

## 2017-12-02 DIAGNOSIS — F4323 Adjustment disorder with mixed anxiety and depressed mood: Secondary | ICD-10-CM | POA: Diagnosis not present

## 2017-12-23 DIAGNOSIS — F4323 Adjustment disorder with mixed anxiety and depressed mood: Secondary | ICD-10-CM | POA: Diagnosis not present

## 2017-12-31 DIAGNOSIS — F4323 Adjustment disorder with mixed anxiety and depressed mood: Secondary | ICD-10-CM | POA: Diagnosis not present

## 2018-01-06 DIAGNOSIS — F4323 Adjustment disorder with mixed anxiety and depressed mood: Secondary | ICD-10-CM | POA: Diagnosis not present

## 2018-01-13 DIAGNOSIS — F4323 Adjustment disorder with mixed anxiety and depressed mood: Secondary | ICD-10-CM | POA: Diagnosis not present

## 2018-01-20 DIAGNOSIS — F4323 Adjustment disorder with mixed anxiety and depressed mood: Secondary | ICD-10-CM | POA: Diagnosis not present

## 2018-01-27 DIAGNOSIS — F4323 Adjustment disorder with mixed anxiety and depressed mood: Secondary | ICD-10-CM | POA: Diagnosis not present

## 2018-02-03 DIAGNOSIS — F4323 Adjustment disorder with mixed anxiety and depressed mood: Secondary | ICD-10-CM | POA: Diagnosis not present

## 2018-02-10 DIAGNOSIS — F4323 Adjustment disorder with mixed anxiety and depressed mood: Secondary | ICD-10-CM | POA: Diagnosis not present

## 2018-02-16 DIAGNOSIS — F419 Anxiety disorder, unspecified: Secondary | ICD-10-CM | POA: Diagnosis not present

## 2018-02-16 DIAGNOSIS — F3341 Major depressive disorder, recurrent, in partial remission: Secondary | ICD-10-CM | POA: Diagnosis not present

## 2018-02-17 DIAGNOSIS — F4323 Adjustment disorder with mixed anxiety and depressed mood: Secondary | ICD-10-CM | POA: Diagnosis not present

## 2018-02-24 DIAGNOSIS — F4323 Adjustment disorder with mixed anxiety and depressed mood: Secondary | ICD-10-CM | POA: Diagnosis not present

## 2018-03-03 DIAGNOSIS — F4323 Adjustment disorder with mixed anxiety and depressed mood: Secondary | ICD-10-CM | POA: Diagnosis not present

## 2018-03-12 DIAGNOSIS — F4323 Adjustment disorder with mixed anxiety and depressed mood: Secondary | ICD-10-CM | POA: Diagnosis not present

## 2018-03-24 DIAGNOSIS — F4323 Adjustment disorder with mixed anxiety and depressed mood: Secondary | ICD-10-CM | POA: Diagnosis not present

## 2018-04-01 DIAGNOSIS — F4323 Adjustment disorder with mixed anxiety and depressed mood: Secondary | ICD-10-CM | POA: Diagnosis not present

## 2018-04-07 DIAGNOSIS — F4323 Adjustment disorder with mixed anxiety and depressed mood: Secondary | ICD-10-CM | POA: Diagnosis not present

## 2018-04-14 DIAGNOSIS — F4323 Adjustment disorder with mixed anxiety and depressed mood: Secondary | ICD-10-CM | POA: Diagnosis not present

## 2018-04-23 DIAGNOSIS — F4323 Adjustment disorder with mixed anxiety and depressed mood: Secondary | ICD-10-CM | POA: Diagnosis not present

## 2018-05-01 DIAGNOSIS — F4323 Adjustment disorder with mixed anxiety and depressed mood: Secondary | ICD-10-CM | POA: Diagnosis not present

## 2018-05-07 DIAGNOSIS — F4323 Adjustment disorder with mixed anxiety and depressed mood: Secondary | ICD-10-CM | POA: Diagnosis not present

## 2018-05-14 DIAGNOSIS — F4323 Adjustment disorder with mixed anxiety and depressed mood: Secondary | ICD-10-CM | POA: Diagnosis not present

## 2018-05-18 DIAGNOSIS — F419 Anxiety disorder, unspecified: Secondary | ICD-10-CM | POA: Diagnosis not present

## 2018-05-18 DIAGNOSIS — F3341 Major depressive disorder, recurrent, in partial remission: Secondary | ICD-10-CM | POA: Diagnosis not present

## 2018-05-19 DIAGNOSIS — F4323 Adjustment disorder with mixed anxiety and depressed mood: Secondary | ICD-10-CM | POA: Diagnosis not present

## 2018-05-26 DIAGNOSIS — F4323 Adjustment disorder with mixed anxiety and depressed mood: Secondary | ICD-10-CM | POA: Diagnosis not present

## 2018-06-02 DIAGNOSIS — F4323 Adjustment disorder with mixed anxiety and depressed mood: Secondary | ICD-10-CM | POA: Diagnosis not present

## 2018-07-06 DIAGNOSIS — F4323 Adjustment disorder with mixed anxiety and depressed mood: Secondary | ICD-10-CM | POA: Diagnosis not present

## 2018-07-14 DIAGNOSIS — F4323 Adjustment disorder with mixed anxiety and depressed mood: Secondary | ICD-10-CM | POA: Diagnosis not present

## 2018-07-20 DIAGNOSIS — F4323 Adjustment disorder with mixed anxiety and depressed mood: Secondary | ICD-10-CM | POA: Diagnosis not present

## 2018-07-28 DIAGNOSIS — F4323 Adjustment disorder with mixed anxiety and depressed mood: Secondary | ICD-10-CM | POA: Diagnosis not present

## 2018-07-30 DIAGNOSIS — Z0000 Encounter for general adult medical examination without abnormal findings: Secondary | ICD-10-CM | POA: Diagnosis not present

## 2018-07-30 DIAGNOSIS — Z23 Encounter for immunization: Secondary | ICD-10-CM | POA: Diagnosis not present

## 2018-07-30 DIAGNOSIS — Z136 Encounter for screening for cardiovascular disorders: Secondary | ICD-10-CM | POA: Diagnosis not present

## 2018-08-07 DIAGNOSIS — F4323 Adjustment disorder with mixed anxiety and depressed mood: Secondary | ICD-10-CM | POA: Diagnosis not present

## 2018-08-11 DIAGNOSIS — F4323 Adjustment disorder with mixed anxiety and depressed mood: Secondary | ICD-10-CM | POA: Diagnosis not present

## 2018-08-18 DIAGNOSIS — F4323 Adjustment disorder with mixed anxiety and depressed mood: Secondary | ICD-10-CM | POA: Diagnosis not present

## 2018-08-25 DIAGNOSIS — F4323 Adjustment disorder with mixed anxiety and depressed mood: Secondary | ICD-10-CM | POA: Diagnosis not present

## 2018-09-02 DIAGNOSIS — F4323 Adjustment disorder with mixed anxiety and depressed mood: Secondary | ICD-10-CM | POA: Diagnosis not present

## 2018-09-18 DIAGNOSIS — F4323 Adjustment disorder with mixed anxiety and depressed mood: Secondary | ICD-10-CM | POA: Diagnosis not present

## 2018-09-21 DIAGNOSIS — F419 Anxiety disorder, unspecified: Secondary | ICD-10-CM | POA: Diagnosis not present

## 2018-09-21 DIAGNOSIS — F3341 Major depressive disorder, recurrent, in partial remission: Secondary | ICD-10-CM | POA: Diagnosis not present

## 2018-09-22 DIAGNOSIS — F4323 Adjustment disorder with mixed anxiety and depressed mood: Secondary | ICD-10-CM | POA: Diagnosis not present

## 2018-09-29 DIAGNOSIS — F4323 Adjustment disorder with mixed anxiety and depressed mood: Secondary | ICD-10-CM | POA: Diagnosis not present

## 2018-10-28 DIAGNOSIS — F4323 Adjustment disorder with mixed anxiety and depressed mood: Secondary | ICD-10-CM | POA: Diagnosis not present

## 2018-11-02 DIAGNOSIS — F3341 Major depressive disorder, recurrent, in partial remission: Secondary | ICD-10-CM | POA: Diagnosis not present

## 2018-11-02 DIAGNOSIS — F419 Anxiety disorder, unspecified: Secondary | ICD-10-CM | POA: Diagnosis not present

## 2018-11-04 DIAGNOSIS — F4323 Adjustment disorder with mixed anxiety and depressed mood: Secondary | ICD-10-CM | POA: Diagnosis not present

## 2018-11-11 DIAGNOSIS — F4323 Adjustment disorder with mixed anxiety and depressed mood: Secondary | ICD-10-CM | POA: Diagnosis not present

## 2018-11-18 DIAGNOSIS — F4323 Adjustment disorder with mixed anxiety and depressed mood: Secondary | ICD-10-CM | POA: Diagnosis not present

## 2018-11-25 DIAGNOSIS — F4323 Adjustment disorder with mixed anxiety and depressed mood: Secondary | ICD-10-CM | POA: Diagnosis not present

## 2018-12-02 DIAGNOSIS — F4323 Adjustment disorder with mixed anxiety and depressed mood: Secondary | ICD-10-CM | POA: Diagnosis not present

## 2018-12-09 DIAGNOSIS — F4323 Adjustment disorder with mixed anxiety and depressed mood: Secondary | ICD-10-CM | POA: Diagnosis not present

## 2018-12-10 ENCOUNTER — Other Ambulatory Visit: Payer: Self-pay | Admitting: Internal Medicine

## 2018-12-10 ENCOUNTER — Ambulatory Visit
Admission: RE | Admit: 2018-12-10 | Discharge: 2018-12-10 | Disposition: A | Discharge status: Home / Self Care | Payer: BLUE CROSS/BLUE SHIELD | Source: Ambulatory Visit | Attending: Internal Medicine | Admitting: Internal Medicine

## 2018-12-10 DIAGNOSIS — S99921A Unspecified injury of right foot, initial encounter: Secondary | ICD-10-CM | POA: Diagnosis not present

## 2018-12-10 DIAGNOSIS — M79671 Pain in right foot: Secondary | ICD-10-CM | POA: Diagnosis not present

## 2018-12-10 DIAGNOSIS — M79644 Pain in right finger(s): Secondary | ICD-10-CM | POA: Diagnosis not present

## 2018-12-16 DIAGNOSIS — F4323 Adjustment disorder with mixed anxiety and depressed mood: Secondary | ICD-10-CM | POA: Diagnosis not present

## 2018-12-23 DIAGNOSIS — F4323 Adjustment disorder with mixed anxiety and depressed mood: Secondary | ICD-10-CM | POA: Diagnosis not present

## 2018-12-30 DIAGNOSIS — F4323 Adjustment disorder with mixed anxiety and depressed mood: Secondary | ICD-10-CM | POA: Diagnosis not present

## 2019-01-01 DIAGNOSIS — R112 Nausea with vomiting, unspecified: Secondary | ICD-10-CM | POA: Diagnosis not present

## 2019-01-01 DIAGNOSIS — R197 Diarrhea, unspecified: Secondary | ICD-10-CM | POA: Diagnosis not present

## 2019-01-05 DIAGNOSIS — R197 Diarrhea, unspecified: Secondary | ICD-10-CM | POA: Diagnosis not present

## 2019-01-06 DIAGNOSIS — F4323 Adjustment disorder with mixed anxiety and depressed mood: Secondary | ICD-10-CM | POA: Diagnosis not present

## 2019-01-13 DIAGNOSIS — F4323 Adjustment disorder with mixed anxiety and depressed mood: Secondary | ICD-10-CM | POA: Diagnosis not present

## 2019-01-20 DIAGNOSIS — F4323 Adjustment disorder with mixed anxiety and depressed mood: Secondary | ICD-10-CM | POA: Diagnosis not present

## 2019-01-27 DIAGNOSIS — F4323 Adjustment disorder with mixed anxiety and depressed mood: Secondary | ICD-10-CM | POA: Diagnosis not present

## 2019-02-03 DIAGNOSIS — F4323 Adjustment disorder with mixed anxiety and depressed mood: Secondary | ICD-10-CM | POA: Diagnosis not present

## 2019-02-10 DIAGNOSIS — F4323 Adjustment disorder with mixed anxiety and depressed mood: Secondary | ICD-10-CM | POA: Diagnosis not present

## 2019-02-17 DIAGNOSIS — F4323 Adjustment disorder with mixed anxiety and depressed mood: Secondary | ICD-10-CM | POA: Diagnosis not present

## 2019-02-24 DIAGNOSIS — F4323 Adjustment disorder with mixed anxiety and depressed mood: Secondary | ICD-10-CM | POA: Diagnosis not present

## 2019-03-03 DIAGNOSIS — F4323 Adjustment disorder with mixed anxiety and depressed mood: Secondary | ICD-10-CM | POA: Diagnosis not present

## 2019-03-10 DIAGNOSIS — F4323 Adjustment disorder with mixed anxiety and depressed mood: Secondary | ICD-10-CM | POA: Diagnosis not present

## 2019-03-17 DIAGNOSIS — F4323 Adjustment disorder with mixed anxiety and depressed mood: Secondary | ICD-10-CM | POA: Diagnosis not present

## 2019-03-24 DIAGNOSIS — F4323 Adjustment disorder with mixed anxiety and depressed mood: Secondary | ICD-10-CM | POA: Diagnosis not present

## 2019-03-31 DIAGNOSIS — F4323 Adjustment disorder with mixed anxiety and depressed mood: Secondary | ICD-10-CM | POA: Diagnosis not present

## 2019-04-06 DIAGNOSIS — R509 Fever, unspecified: Secondary | ICD-10-CM | POA: Diagnosis not present

## 2019-04-06 DIAGNOSIS — Z1159 Encounter for screening for other viral diseases: Secondary | ICD-10-CM | POA: Diagnosis not present

## 2019-04-06 DIAGNOSIS — R197 Diarrhea, unspecified: Secondary | ICD-10-CM | POA: Diagnosis not present

## 2019-04-07 DIAGNOSIS — F4323 Adjustment disorder with mixed anxiety and depressed mood: Secondary | ICD-10-CM | POA: Diagnosis not present

## 2019-04-14 DIAGNOSIS — F4323 Adjustment disorder with mixed anxiety and depressed mood: Secondary | ICD-10-CM | POA: Diagnosis not present

## 2019-04-21 DIAGNOSIS — F4323 Adjustment disorder with mixed anxiety and depressed mood: Secondary | ICD-10-CM | POA: Diagnosis not present

## 2019-04-28 DIAGNOSIS — F4323 Adjustment disorder with mixed anxiety and depressed mood: Secondary | ICD-10-CM | POA: Diagnosis not present

## 2019-05-04 DIAGNOSIS — F4323 Adjustment disorder with mixed anxiety and depressed mood: Secondary | ICD-10-CM | POA: Diagnosis not present

## 2019-05-12 DIAGNOSIS — F4323 Adjustment disorder with mixed anxiety and depressed mood: Secondary | ICD-10-CM | POA: Diagnosis not present

## 2019-05-13 DIAGNOSIS — F419 Anxiety disorder, unspecified: Secondary | ICD-10-CM | POA: Diagnosis not present

## 2019-05-13 DIAGNOSIS — F3341 Major depressive disorder, recurrent, in partial remission: Secondary | ICD-10-CM | POA: Diagnosis not present

## 2019-05-19 DIAGNOSIS — F4323 Adjustment disorder with mixed anxiety and depressed mood: Secondary | ICD-10-CM | POA: Diagnosis not present

## 2019-05-26 DIAGNOSIS — F4323 Adjustment disorder with mixed anxiety and depressed mood: Secondary | ICD-10-CM | POA: Diagnosis not present

## 2019-06-02 DIAGNOSIS — F4323 Adjustment disorder with mixed anxiety and depressed mood: Secondary | ICD-10-CM | POA: Diagnosis not present

## 2019-06-03 DIAGNOSIS — F4323 Adjustment disorder with mixed anxiety and depressed mood: Secondary | ICD-10-CM | POA: Diagnosis not present

## 2019-06-10 DIAGNOSIS — K582 Mixed irritable bowel syndrome: Secondary | ICD-10-CM | POA: Diagnosis not present

## 2019-06-10 DIAGNOSIS — F4323 Adjustment disorder with mixed anxiety and depressed mood: Secondary | ICD-10-CM | POA: Diagnosis not present

## 2019-06-10 DIAGNOSIS — M7918 Myalgia, other site: Secondary | ICD-10-CM | POA: Diagnosis not present

## 2019-06-16 DIAGNOSIS — F4323 Adjustment disorder with mixed anxiety and depressed mood: Secondary | ICD-10-CM | POA: Diagnosis not present

## 2019-06-23 DIAGNOSIS — F4323 Adjustment disorder with mixed anxiety and depressed mood: Secondary | ICD-10-CM | POA: Diagnosis not present

## 2019-06-30 DIAGNOSIS — F4323 Adjustment disorder with mixed anxiety and depressed mood: Secondary | ICD-10-CM | POA: Diagnosis not present

## 2019-07-07 DIAGNOSIS — F4323 Adjustment disorder with mixed anxiety and depressed mood: Secondary | ICD-10-CM | POA: Diagnosis not present

## 2019-07-14 DIAGNOSIS — F4323 Adjustment disorder with mixed anxiety and depressed mood: Secondary | ICD-10-CM | POA: Diagnosis not present

## 2019-07-21 DIAGNOSIS — F4323 Adjustment disorder with mixed anxiety and depressed mood: Secondary | ICD-10-CM | POA: Diagnosis not present

## 2019-07-28 DIAGNOSIS — F4323 Adjustment disorder with mixed anxiety and depressed mood: Secondary | ICD-10-CM | POA: Diagnosis not present

## 2019-08-04 DIAGNOSIS — Z0000 Encounter for general adult medical examination without abnormal findings: Secondary | ICD-10-CM | POA: Diagnosis not present

## 2019-08-04 DIAGNOSIS — Z23 Encounter for immunization: Secondary | ICD-10-CM | POA: Diagnosis not present

## 2019-08-04 DIAGNOSIS — F3342 Major depressive disorder, recurrent, in full remission: Secondary | ICD-10-CM | POA: Diagnosis not present

## 2019-08-04 DIAGNOSIS — K582 Mixed irritable bowel syndrome: Secondary | ICD-10-CM | POA: Diagnosis not present

## 2019-08-04 DIAGNOSIS — F4323 Adjustment disorder with mixed anxiety and depressed mood: Secondary | ICD-10-CM | POA: Diagnosis not present

## 2019-08-04 DIAGNOSIS — Z13220 Encounter for screening for lipoid disorders: Secondary | ICD-10-CM | POA: Diagnosis not present

## 2019-08-04 DIAGNOSIS — Z86018 Personal history of other benign neoplasm: Secondary | ICD-10-CM | POA: Diagnosis not present

## 2019-08-11 DIAGNOSIS — F4323 Adjustment disorder with mixed anxiety and depressed mood: Secondary | ICD-10-CM | POA: Diagnosis not present

## 2019-08-12 DIAGNOSIS — F3341 Major depressive disorder, recurrent, in partial remission: Secondary | ICD-10-CM | POA: Diagnosis not present

## 2019-08-12 DIAGNOSIS — F419 Anxiety disorder, unspecified: Secondary | ICD-10-CM | POA: Diagnosis not present

## 2019-08-18 DIAGNOSIS — F4323 Adjustment disorder with mixed anxiety and depressed mood: Secondary | ICD-10-CM | POA: Diagnosis not present

## 2019-08-25 DIAGNOSIS — F4323 Adjustment disorder with mixed anxiety and depressed mood: Secondary | ICD-10-CM | POA: Diagnosis not present

## 2019-08-26 ENCOUNTER — Other Ambulatory Visit: Payer: Self-pay

## 2019-08-26 ENCOUNTER — Encounter: Payer: Self-pay | Admitting: Allergy & Immunology

## 2019-08-26 ENCOUNTER — Ambulatory Visit: Payer: BC Managed Care – PPO | Admitting: Allergy & Immunology

## 2019-08-26 VITALS — BP 116/78 | HR 93 | Temp 97.8°F | Resp 18 | Ht 64.0 in | Wt 136.0 lb

## 2019-08-26 DIAGNOSIS — J302 Other seasonal allergic rhinitis: Secondary | ICD-10-CM

## 2019-08-26 DIAGNOSIS — J3089 Other allergic rhinitis: Secondary | ICD-10-CM

## 2019-08-26 DIAGNOSIS — L2089 Other atopic dermatitis: Secondary | ICD-10-CM

## 2019-08-26 HISTORY — DX: Other atopic dermatitis: L20.89

## 2019-08-26 MED ORDER — AZELASTINE HCL 0.1 % NA SOLN: NASAL | 5 refills | Status: DC

## 2019-08-26 MED ORDER — TRIAMCINOLONE ACETONIDE 0.1 % EX OINT: 1.0000 "application " | TOPICAL_OINTMENT | Freq: Two times a day (BID) | CUTANEOUS | 2 refills | Status: AC

## 2019-08-26 MED ORDER — XHANCE 93 MCG/ACT NA EXHU: 1.0000 | INHALANT_SUSPENSION | Freq: Two times a day (BID) | NASAL | 5 refills | Status: DC

## 2019-08-26 NOTE — Patient Instructions (Addendum)
1. Seasonal and perennial allergic rhinitis - Testing today showed: grasses, ragweed, weeds, trees, indoor molds, outdoor molds, dust mites, cat, dog and cockroach - Copy of test results provided.  - Avoidance measures provided. - Continue with: Xyzal (levocetirizine) 5mg  tablet twice daily - Start taking: Xhance (fluticasone) 1-2 sprays per nostril twice daily and Astelin (azelastine) 2 sprays per nostril 1-2 times daily as needed - You can use an extra dose of the antihistamine, if needed, for breakthrough symptoms.  - We will send in the script to the [facility], and they will call you to confirm your shipping address. - You can review how to use the device here: [email or web address].xhance.[email or web address] - Ask to be enrolled in the auto-refill program so you can get a year for free. - Consider nasal saline rinses 1-2 times daily to remove allergens from the nasal cavities as well as help with mucous clearance (this is especially helpful to do before the nasal sprays are given) - Consider allergy shots as a means of long-term control. - Allergy shots "re-train" and "reset" the immune system to ignore environmental allergens and decrease the resulting immune response to those allergens (sneezing, itchy watery eyes, runny nose, nasal congestion, etc).    - Allergy shots improve symptoms in 75-85% of patients.  - We can discuss more at the next appointment if the medications are not working for you.  2. Return in about 3 months (around 11/26/2019). This can be an in-person, a virtual Webex or a telephone follow up visit.   Please inform us of any Emergency Department visits, hospitalizations, or changes in symptoms. Call us before going to the ED for breathing or allergy symptoms since we might be able to fit you in for a sick visit. Feel free to contact us anytime with any questions, problems, or concerns.  It was a pleasure to meet you today!  Websites that have reliable patient information: 1.  American Academy of Asthma, Allergy, and Immunology: www.aaaai.org 2. Food Allergy Research and Education (FARE): foodallergy.org 3. Mothers of Asthmatics: http://www.asthmacommunitynetwork.org 4. American College of Allergy, Asthma, and Immunology: www.acaai.org  "Like" [place] on Facebook and Instagram for our latest updates!      Make sure you are registered to vote! If you have moved or changed any of your contact information, you will need to get this updated before voting!  In some cases, you MAY be able to register to vote online: [email or web address]     Reducing Pollen Exposure  The American Academy of Allergy, Asthma and Immunology suggests the following steps to reduce your exposure to pollen during allergy seasons.    1. Do not hang sheets or clothing out to dry; pollen may collect on these items. 2. Do not mow lawns or spend time around freshly cut grass; mowing stirs up pollen. 3. Keep windows closed at night.  Keep car windows closed while driving. 4. Minimize morning activities outdoors, a time when pollen counts are usually at their highest. 5. Stay indoors as much as possible when pollen counts or humidity is high and on windy days when pollen tends to remain in the air longer. 6. Use air conditioning when possible.  Many air conditioners have filters that trap the pollen spores. 7. Use a HEPA room air filter to remove pollen form the indoor air you breathe.  Control of Mold Allergen   Mold and fungi can grow on a variety of surfaces provided certain temperature and moisture conditions exist.  Outdoor molds grow on plants,  decaying vegetation and soil.  The major outdoor mold, Alternaria and Cladosporium, are found in very high numbers during hot and dry conditions.  Generally, a late Summer - Fall peak is seen for common outdoor fungal spores.  Rain will temporarily lower outdoor mold spore count, but counts rise rapidly when the rainy period  ends.  The most important indoor molds are Aspergillus and Penicillium.  Dark, humid and poorly ventilated basements are ideal sites for mold growth.  The next most common sites of mold growth are the bathroom and the kitchen.  Outdoor (Seasonal) Mold Control  Positive outdoor molds via skin testing: Alternaria and Cladosporium  1. Use air conditioning and keep windows closed 2. Avoid exposure to decaying vegetation. 3. Avoid leaf raking. 4. Avoid grain handling. 5. Consider wearing a face mask if working in moldy areas.  6.   Indoor (Perennial) Mold Control   Positive indoor molds via skin testing: Aspergillus, Penicillium, Fusarium, Aureobasidium (Pullulara) and Rhizopus  1. Maintain humidity below 50%. 2. Clean washable surfaces with 5% bleach solution. 3. Remove sources e.g. contaminated carpets.   Control of Cockroach Allergen  Cockroach allergen has been identified as an important cause of acute attacks of asthma, especially in urban settings.  There are fifty-five species of cockroach that exist in the [place], however only three, the [place], [place] species produce allergen that can affect patients with Asthma.  Allergens can be obtained from fecal particles, egg casings and secretions from cockroaches.    1. Remove food sources. 2. Reduce access to water. 3. Seal access and entry points. 4. Spray runways with 0.5-1% Diazinon or Chlorpyrifos 5. Blow boric acid power under stoves and refrigerator. 6. Place bait stations (hydramethylnon) at feeding sites.    Control of Dog or Cat Allergen  Avoidance is the best way to manage a dog or cat allergy. If you have a dog or cat and are allergic to dog or cats, consider removing the dog or cat from the home. If you have a dog or cat but don't want to find it a new home, or if your family wants a pet even though someone in the household is allergic, here are some strategies that may help keep symptoms at bay:   1. Keep the pet out of your bedroom and restrict it to only a few rooms. Be advised that keeping the dog or cat in only one room will not limit the allergens to that room. 2. Don't pet, hug or kiss the dog or cat; if you do, wash your hands with soap and water. 3. High-efficiency particulate air (HEPA) cleaners run continuously in a bedroom or living room can reduce allergen levels over time. 4. Regular use of a high-efficiency vacuum cleaner or a central vacuum can reduce allergen levels. 5. Giving your dog or cat a bath at least once a week can reduce airborne allergen.  Control of Cockroach Allergen  Cockroach allergen has been identified as an important cause of acute attacks of asthma, especially in urban settings.  There are fifty-five species of cockroach that exist in the [place], however only three, the [place], [place] species produce allergen that can affect patients with Asthma.  Allergens can be obtained from fecal particles, egg casings and secretions from cockroaches.    7. Remove food sources. 8. Reduce access to water. 9. Seal access and entry points. 10. Spray runways with 0.5-1% Diazinon or Chlorpyrifos 11. Blow boric acid power under stoves and refrigerator.  12. Place bait stations (hydramethylnon) at feeding sites.  Control of Dust Mite Allergen    Dust mites play a major role in allergic asthma and rhinitis.  They occur in environments with high humidity wherever human skin is found.  Dust mites absorb humidity from the atmosphere (ie, they do not drink) and feed on organic matter (including shed human and animal skin).  Dust mites are a microscopic type of insect that you cannot see with the naked eye.  High levels of dust mites have been detected from mattresses, pillows, carpets, upholstered furniture, bed covers, clothes, soft toys and any woven material.  The principal allergen of the dust mite is found in its feces.  A gram of dust may contain  1,000 mites and 250,000 fecal particles.  Mite antigen is easily measured in the air during house cleaning activities.  Dust mites do not bite and do not cause harm to humans, other than by triggering allergies/asthma.    Ways to decrease your exposure to dust mites in your home:  1. Encase mattresses, box springs and pillows with a mite-impermeable barrier or cover   2. Wash sheets, blankets and drapes weekly in hot water (130 F) with detergent and dry them in a dryer on the hot setting.  3. Have the room cleaned frequently with a vacuum cleaner and a damp dust-mop.  For carpeting or rugs, vacuuming with a vacuum cleaner equipped with a high-efficiency particulate air (HEPA) filter.  The dust mite allergic individual should not be in a room which is being cleaned and should wait 1 hour after cleaning before going into the room. 4. Do not sleep on upholstered furniture (eg, couches).   5. If possible removing carpeting, upholstered furniture and drapery from the home is ideal.  Horizontal blinds should be eliminated in the rooms where the person spends the most time (bedroom, study, television room).  Washable vinyl, roller-type shades are optimal. 6. Remove all non-washable stuffed toys from the bedroom.  Wash stuffed toys weekly like sheets and blankets above.   7. Reduce indoor humidity to less than 50%.  Inexpensive humidity monitors can be purchased at most hardware stores.  Do not use a humidifier as can make the problem worse and are not recommended.  Allergy Shots   Allergies are the result of a chain reaction that starts in the immune system. Your immune system controls how your body defends itself. For instance, if you have an allergy to pollen, your immune system identifies pollen as an invader or allergen. Your immune system overreacts by producing antibodies called Immunoglobulin E (IgE). These antibodies travel to cells that release chemicals, causing an allergic reaction.  The concept  behind allergy immunotherapy, whether it is received in the form of shots or tablets, is that the immune system can be desensitized to specific allergens that trigger allergy symptoms. Although it requires time and patience, the payback can be long-term relief.  How Do Allergy Shots Work?  Allergy shots work much like a vaccine. Your body responds to injected amounts of a particular allergen given in increasing doses, eventually developing a resistance and tolerance to it. Allergy shots can lead to decreased, minimal or no allergy symptoms.  There generally are two phases: build-up and maintenance. Build-up often ranges from three to six months and involves receiving injections with increasing amounts of the allergens. The shots are typically given once or twice a week, though more rapid build-up schedules are sometimes used.  The maintenance phase begins when  the most effective dose is reached. This dose is different for each person, depending on how allergic you are and your response to the build-up injections. Once the maintenance dose is reached, there are longer periods between injections, typically two to four weeks.  Occasionally doctors give cortisone-type shots that can temporarily reduce allergy symptoms. These types of shots are different and should not be confused with allergy immunotherapy shots.  Who Can Be Treated with Allergy Shots?  Allergy shots may be a good treatment approach for people with allergic rhinitis (hay fever), allergic asthma, conjunctivitis (eye allergy) or stinging insect allergy.   Before deciding to begin allergy shots, you should consider:  . The length of allergy season and the severity of your symptoms . Whether medications and/or changes to your environment can control your symptoms . Your desire to avoid long-term medication use . Time: allergy immunotherapy requires a major time commitment . Cost: may vary depending on your insurance coverage  Allergy  shots for children age 48 and older are effective and often well tolerated. They might prevent the onset of new allergen sensitivities or the progression to asthma.  Allergy shots are not started on patients who are pregnant but can be continued on patients who become pregnant while receiving them. In some patients with other medical conditions or who take certain common medications, allergy shots may be of risk. It is important to mention other medications you talk to your allergist.   When Will I Feel Better?  Some may experience decreased allergy symptoms during the build-up phase. For others, it may take as long as 12 months on the maintenance dose. If there is no improvement after a year of maintenance, your allergist will discuss other treatment options with you.  If you aren't responding to allergy shots, it may be because there is not enough dose of the allergen in your vaccine or there are missing allergens that were not identified during your allergy testing. Other reasons could be that there are high levels of the allergen in your environment or major exposure to non-allergic triggers like tobacco smoke.  What Is the Length of Treatment?  Once the maintenance dose is reached, allergy shots are generally continued for three to five years. The decision to stop should be discussed with your allergist at that time. Some people may experience a permanent reduction of allergy symptoms. Others may relapse and a longer course of allergy shots can be considered.  What Are the Possible Reactions?  The two types of adverse reactions that can occur with allergy shots are local and systemic. Common local reactions include very mild redness and swelling at the injection site, which can happen immediately or several hours after. A systemic reaction, which is less common, affects the entire body or a particular body system. They are usually mild and typically respond quickly to medications. Signs  include increased allergy symptoms such as sneezing, a stuffy nose or hives.  Rarely, a serious systemic reaction called anaphylaxis can develop. Symptoms include swelling in the throat, wheezing, a feeling of tightness in the chest, nausea or dizziness. Most serious systemic reactions develop within 30 minutes of allergy shots. This is why it is strongly recommended you wait in your doctor's office for 30 minutes after your injections. Your allergist is trained to watch for reactions, and his or her staff is trained and equipped with the proper medications to identify and treat them.  Who Should Administer Allergy Shots?  The preferred location for receiving shots  is your prescribing allergist's office. Injections can sometimes be given at another facility where the physician and staff are trained to recognize and treat reactions, and have received instructions by your prescribing allergist.

## 2019-08-26 NOTE — Progress Notes (Signed)
NEW PATIENT  Date of Service/Encounter:  08/26/19  Referring provider: Leeroy Cha, MD   Assessment:   Seasonal and perennial allergic rhinitis (grasses, ragweed, weeds, trees, indoor molds, outdoor molds, dust mites, cat, dog and cockroach)  Eczema  Plan/Recommendations:   1. Seasonal and perennial allergic rhinitis - Testing today showed: grasses, ragweed, weeds, trees, indoor molds, outdoor molds, dust mites, cat, dog and cockroach - Copy of test results provided.  - Avoidance measures provided. - Continue with: Xyzal (levocetirizine) 36m tablet twice daily - Start taking: Xhance (fluticasone) 1-2 sprays per nostril twice daily and Astelin (azelastine) 2 sprays per nostril 1-2 times daily as needed - You can use an extra dose of the antihistamine, if needed, for breakthrough symptoms.  - We will send in the script to the [facility] and they will call you to confirm your shipping address. - You can review how to use the device here: [email or web address]://www.xhance.com - Ask to be enrolled in the auto-refill program so you can get a year for free. - Consider nasal saline rinses 1-2 times daily to remove allergens from the nasal cavities as well as help with mucous clearance (this is especially helpful to do before the nasal sprays are given) - Consider allergy shots as a means of long-term control. - Allergy shots "re-train" and "reset" the immune system to ignore environmental allergens and decrease the resulting immune response to those allergens (sneezing, itchy watery eyes, runny nose, nasal congestion, etc).    - Allergy shots improve symptoms in 75-85% of patients.  - We can discuss more at the next appointment if the medications are not working for you.  2. Eczema  - Continue with moisturizing twice daily. - Add on triamcinolone ointment 0.1% twice daily as needed for flares.  3. Return in about 3 months (around 11/26/2019). This can be an in-person, a  virtual Webex or a telephone follow up visit.   Subjective:   MFrederika Hukillis a 38y.o. female presenting today for evaluation of  Chief Complaint  Patient presents with  . Allergies    Nasal congestion, vertigo, ear pain since 2015    [facility] a history of the following: Patient Active Problem List   Diagnosis Date Noted  . Flexural atopic dermatitis 08/26/2019  . Seasonal and perennial allergic rhinitis 08/26/2019  . Abnormal uterine bleeding 10/09/2016    History obtained from: chart review and patient.  MLilia Arguewas referred by VLeeroy Cha MD.     MEnnisis a 38y.o. female presenting for an evaluation of chronic rhinitis.   Allergic Rhinitis Symptom History: She reports that she has bene in this area for three years. She tells me that she tends to have colds more in the winte rthan the rest of the year. But they are more frequent. [place]. She takes Xyzal for these episodes. Allegra stopped working. She takes this twice daily. She never needs steroids or antibiotics for these episodes. She does have dogs outside of her home but they do not make symptoms worse. Symptoms last for upwards of two weeks, but typically this is around a couple of days. She does report a headache but denies any ocular symptoms or throat clearing. She thinks that she has tried Flonase around one year ago. She did notice an improvement but she did not stick with it.   Eczema Symptom History: She does have intermittent eczema. This does ot  seem to worsen during any part of the year. She uses a prescription steroid ointment that she received from a dermatologist in [place]. Now she is using only calamine. She would like a prescription for a topical steroid for her skin. There are no concerns with food allergies.   Otherwise, there is no history of other atopic diseases, including asthma, food allergies, drug  allergies, stinging insect allergies, urticaria or contact dermatitis. There is no significant infectious history. Vaccinations are up to date.    Past Medical History: Patient Active Problem List   Diagnosis Date Noted  . Flexural atopic dermatitis 08/26/2019  . Seasonal and perennial allergic rhinitis 08/26/2019  . Abnormal uterine bleeding 10/09/2016    Medication List:  Allergies as of 08/26/2019      Reactions   Codeine    Palpitations, hot flashes, and dizziness   Floxin [ofloxacin] Hives   Vicodin [hydrocodone-acetaminophen]    Palpitations, hot flashes, and dizziness   Penicillins Rash   Has patient had a PCN reaction causing immediate rash, facial/tongue/throat swelling, SOB or lightheadedness with hypotension: Yes Has patient had a PCN reaction causing severe rash involving mucus membranes or skin necrosis: Yes Has patient had a PCN reaction that required hospitalization No Has patient had a PCN reaction occurring within the last 10 years: No If all of the above answers are "NO", then may proceed with Cephalosporin use.   Sulfa Antibiotics Rash      Medication List       Accurate as of August 26, 2019  4:50 PM. If you have any questions, ask your nurse or doctor.        STOP taking these medications   oxyCODONE-acetaminophen 5-325 MG tablet Commonly known as: PERCOCET/ROXICET Stopped by: Valentina Shaggy, MD     TAKE these medications   Aplenzin 174 MG Tb24 Generic drug: BuPROPion HBr Take 1 tablet by mouth daily.   azelastine 0.1 % nasal spray Commonly known as: ASTELIN Use 2 sprays in each nostril 1-2 times daily as needed. Started by: Valentina Shaggy, MD   busPIRone 5 MG tablet Commonly known as: BUSPAR Take 5 mg by mouth 3 (three) times daily.   cholecalciferol 1000 units tablet Commonly known as: VITAMIN D Take 1,000 Units by mouth daily.   docusate sodium 100 MG capsule Commonly known as: COLACE Take 1 capsule (100 mg total) by  mouth 2 (two) times daily.   ibuprofen 600 MG tablet Commonly known as: ADVIL Take 1 tablet (600 mg total) by mouth every 6 (six) hours as needed (mild pain).   lamoTRIgine 200 MG tablet Commonly known as: LAMICTAL Take 200 mg by mouth daily.   levocetirizine 5 MG tablet Commonly known as: XYZAL Take 5 mg by mouth 2 (two) times daily.   venlafaxine 37.5 MG tablet Commonly known as: EFFEXOR Take 37.5 mg by mouth 2 (two) times daily.   Xhance 93 MCG/ACT [facility] Generic drug: Fluticasone Propionate Place 1-2 sprays into the nose 2 (two) times daily. Started by: Valentina Shaggy, MD       Birth History: non-contributory  Developmental History: Bradley has met all milestones on time. She has required no speech therapy, occupational therapy and physical therapy.   Past Surgical History: Past Surgical History:  Procedure Laterality Date  . DIAGNOSTIC LAPAROSCOPY    . DILATION AND CURETTAGE OF UTERUS    . HYSTEROSCOPY W/D&C    . INTRAUTERINE DEVICE (IUD) INSERTION    . IUD REMOVAL  Family History: Family History  Problem Relation Age of Onset  . Allergic rhinitis Mother   . Allergic rhinitis Father   . Allergic rhinitis Sister   . Angioedema Neg Hx   . Asthma Neg Hx   . Atopy Neg Hx   . Eczema Neg Hx   . Immunodeficiency Neg Hx   . Urticaria Neg Hx      Social History: Reba lives at home with her husband. They have been married for 12 years.  She lives in a house that is 39 years old.  There is hardwood throughout the home.  She has gas heating and central cooling.  There are dogs outside of the home.  She does have dust mite covers on her pillow, but not her bed.  There is no tobacco exposure.  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    Review of Systems  Constitutional: Negative.  Negative for chills, fever, malaise/fatigue and weight loss.  HENT: Positive for congestion and sore throat. Negative for ear discharge and ear  pain.        Positive for headache.   Eyes: Negative for pain, discharge and redness.  Respiratory: Negative for cough, sputum production, shortness of breath and wheezing.   Cardiovascular: Negative.  Negative for chest pain and palpitations.  Gastrointestinal: Negative for abdominal pain, constipation, diarrhea, heartburn, nausea and vomiting.  Skin: Negative.  Negative for itching and rash.  Neurological: Negative for dizziness and headaches.  Endo/Heme/Allergies: Negative for environmental allergies. Does not bruise/bleed easily.       Objective:   Blood pressure 116/78, pulse 93, temperature 97.8 F (36.6 C), temperature source Temporal, resp. rate 18, height '5\' 4"'  (1.626 m), weight 136 lb (61.7 kg), SpO2 96 %. Body mass index is 23.34 kg/m.   Physical Exam:   Physical Exam  Constitutional: She appears well-developed.  HENT:  Head: Normocephalic and atraumatic.  Right Ear: Tympanic membrane, external ear and ear canal normal. No drainage, swelling or tenderness. Tympanic membrane is not injected, not scarred, not erythematous, not retracted and not bulging.  Left Ear: Tympanic membrane, external ear and ear canal normal. No drainage, swelling or tenderness. Tympanic membrane is not injected, not scarred, not erythematous, not retracted and not bulging.  Nose: Mucosal edema and rhinorrhea present. No nasal deformity or septal deviation. No epistaxis. Right sinus exhibits no maxillary sinus tenderness and no frontal sinus tenderness. Left sinus exhibits no maxillary sinus tenderness and no frontal sinus tenderness.  Mouth/Throat: Uvula is midline and oropharynx is clear and moist. Mucous membranes are not pale and not dry.  Tonsils 2+ bilaterally without discharge.  Eyes: Pupils are equal, round, and reactive to light. Conjunctivae and EOM are normal. Right eye exhibits no chemosis and no discharge. Left eye exhibits no chemosis and no discharge. Right conjunctiva is not injected.  Left conjunctiva is not injected.  Cardiovascular: Normal rate, regular rhythm and normal heart sounds.  Respiratory: Effort normal and breath sounds normal. No accessory muscle usage. No tachypnea. No respiratory distress. She has no wheezes. She has no rhonchi. She has no rales. She exhibits no tenderness.  Moving air well in all lung fields. No increased work on breathing noted. t  GI: There is no abdominal tenderness. There is no rebound and no guarding.  Lymphadenopathy:       Head (right side): No submandibular, no tonsillar and no occipital adenopathy present.       Head (left side): No submandibular, no tonsillar  and no occipital adenopathy present.    She has no cervical adenopathy.  Neurological: She is alert.  Skin: No abrasion, no petechiae and no rash noted. Rash is not papular, not vesicular and not urticarial. No erythema. No pallor.  No eczematous or urticarial lesions noted.  Psychiatric: She has a normal mood and affect.     Diagnostic studies:     Allergy Studies:    Airborne Adult Perc - 08/26/19 1529    Time Antigen Placed  1529    Allergen Manufacturer  Lavella Hammock    Location  Back    Number of Test  59    Panel 1  Select    1. Control-Buffer 50% Glycerol  Negative    2. Control-Histamine 1 mg/ml  2+    3. Albumin saline  Negative    4. [place]  3+    5. [place]  3+    6. [place]  2+    7. [place] Blue  Negative    8. Meadow Fescue  2+    9. Perennial Rye  3+    10. Sweet Vernal  2+    11. Timothy  2+    12. Cocklebur  2+    13. Burweed Marshelder  Negative    14. Ragweed, short  2+    15. Ragweed, Giant  Negative    16. Plantain,  English  --   +/-   17. Lamb's Quarters  2+    18. Sheep Sorrell  --   +/-   19. Rough Pigweed  2+    20. Marsh Elder, Rough  Negative    21. Mugwort, Common  2+    22. Ash mix  2+    23. Birch mix  2+    24. Beech American  2+    25. Box, Elder  2+    26. Cedar, red  Negative    27. Cottonwood, Eastern  3+    28. Elm  mix  2+    29. Hickory mix  2+    30. Maple mix  Negative    31. Oak, [place] mix  2+    32. Pecan Pollen  2+    33. Pine mix  Negative    34. Sycamore Eastern  Negative    35. [place], Black Pollen  2+    36. Alternaria alternata  Negative    37. Cladosporium Herbarum  Negative    38. Aspergillus mix  Negative    39. Penicillium mix  Negative    40. Bipolaris sorokiniana (Helminthosporium)  Negative    41. Drechslera spicifera (Curvularia)  Negative    42. Mucor plumbeus  Negative    43. Fusarium moniliforme  Negative    44. Aureobasidium pullulans (pullulara)  Negative    45. Rhizopus oryzae  Negative    46. Botrytis cinera  Negative    47. Epicoccum nigrum  2+    48. Phoma betae  Negative    49. Candida Albicans  Negative    50. Trichophyton mentagrophytes  Negative    51. Mite, D Farinae  5,000 AU/ml  2+    52. Mite, D Pteronyssinus  5,000 AU/ml  3+    53. Cat Hair 10,000 BAU/ml  Negative    54.  Dog Epithelia  3+    55. Mixed Feathers  Negative    56. Horse Epithelia  Negative    57. Cockroach, German  Negative    58. Mouse  Negative  59. Tobacco Leaf  Negative     Intradermal - 08/26/19 1533    Allergen Manufacturer  Lavella Hammock    Location  Back    Number of Test  7    Intradermal  Select    Control  Negative    Mold 1  2+    Mold 2  2+    Mold 3  Negative    Mold 4  3+    Cat  4+    Cockroach  2+       Allergy testing results were read and interpreted by myself, documented by clinical staff.         Salvatore Marvel, MD [facility] [place] [facility] [place]

## 2019-09-01 DIAGNOSIS — F4323 Adjustment disorder with mixed anxiety and depressed mood: Secondary | ICD-10-CM | POA: Diagnosis not present

## 2019-09-07 DIAGNOSIS — F4323 Adjustment disorder with mixed anxiety and depressed mood: Secondary | ICD-10-CM | POA: Diagnosis not present

## 2019-09-15 DIAGNOSIS — F4323 Adjustment disorder with mixed anxiety and depressed mood: Secondary | ICD-10-CM | POA: Diagnosis not present

## 2019-09-22 DIAGNOSIS — F4323 Adjustment disorder with mixed anxiety and depressed mood: Secondary | ICD-10-CM | POA: Diagnosis not present

## 2019-09-29 DIAGNOSIS — F4323 Adjustment disorder with mixed anxiety and depressed mood: Secondary | ICD-10-CM | POA: Diagnosis not present

## 2019-10-05 DIAGNOSIS — F4323 Adjustment disorder with mixed anxiety and depressed mood: Secondary | ICD-10-CM | POA: Diagnosis not present

## 2019-10-11 DIAGNOSIS — F4323 Adjustment disorder with mixed anxiety and depressed mood: Secondary | ICD-10-CM | POA: Diagnosis not present

## 2019-10-26 ENCOUNTER — Telehealth: Payer: Self-pay

## 2019-10-26 NOTE — Telephone Encounter (Signed)
PA for Wanda Lee was initiated through [email or web address] awaiting approval

## 2019-10-27 NOTE — Telephone Encounter (Signed)
Wanda Lee was not approved through [email or web address] and must use Flonase as the alternative.

## 2019-10-28 MED ORDER — FLUTICASONE PROPIONATE 50 MCG/ACT NA SUSP: 2.0000 | Freq: Two times a day (BID) | NASAL | 5 refills | Status: AC

## 2019-10-28 NOTE — Telephone Encounter (Signed)
I suppose we can do Flonase 2 sprays per nostril up to twice daily.  Please ask her to give [place] an update in a week or so.  Salvatore Marvel, MD [facility] [place] [facility] [place]

## 2019-10-28 NOTE — Telephone Encounter (Signed)
Patient was advised to try Flonase due to no coverage on Xhance. Patient verbalized understanding and will do a follow up call letting us know if the medication works.

## 2019-10-28 NOTE — Addendum Note (Signed)
Addended by: Valere Dross on: 10/28/2019 05:01 PM   Modules accepted: Orders

## 2019-12-02 ENCOUNTER — Ambulatory Visit: Payer: BC Managed Care – PPO | Admitting: Allergy & Immunology

## 2019-12-23 IMAGING — DX DG FOOT COMPLETE 3+V*R*
3 series · 3 of 3 positions shown · non-contrast
Comparison: No prior.

CLINICAL DATA: Right foot injury.  Prior fall.

EXAM:
RIGHT FOOT COMPLETE - 3+ VIEW

[dg foot complete right (1 of 3)]
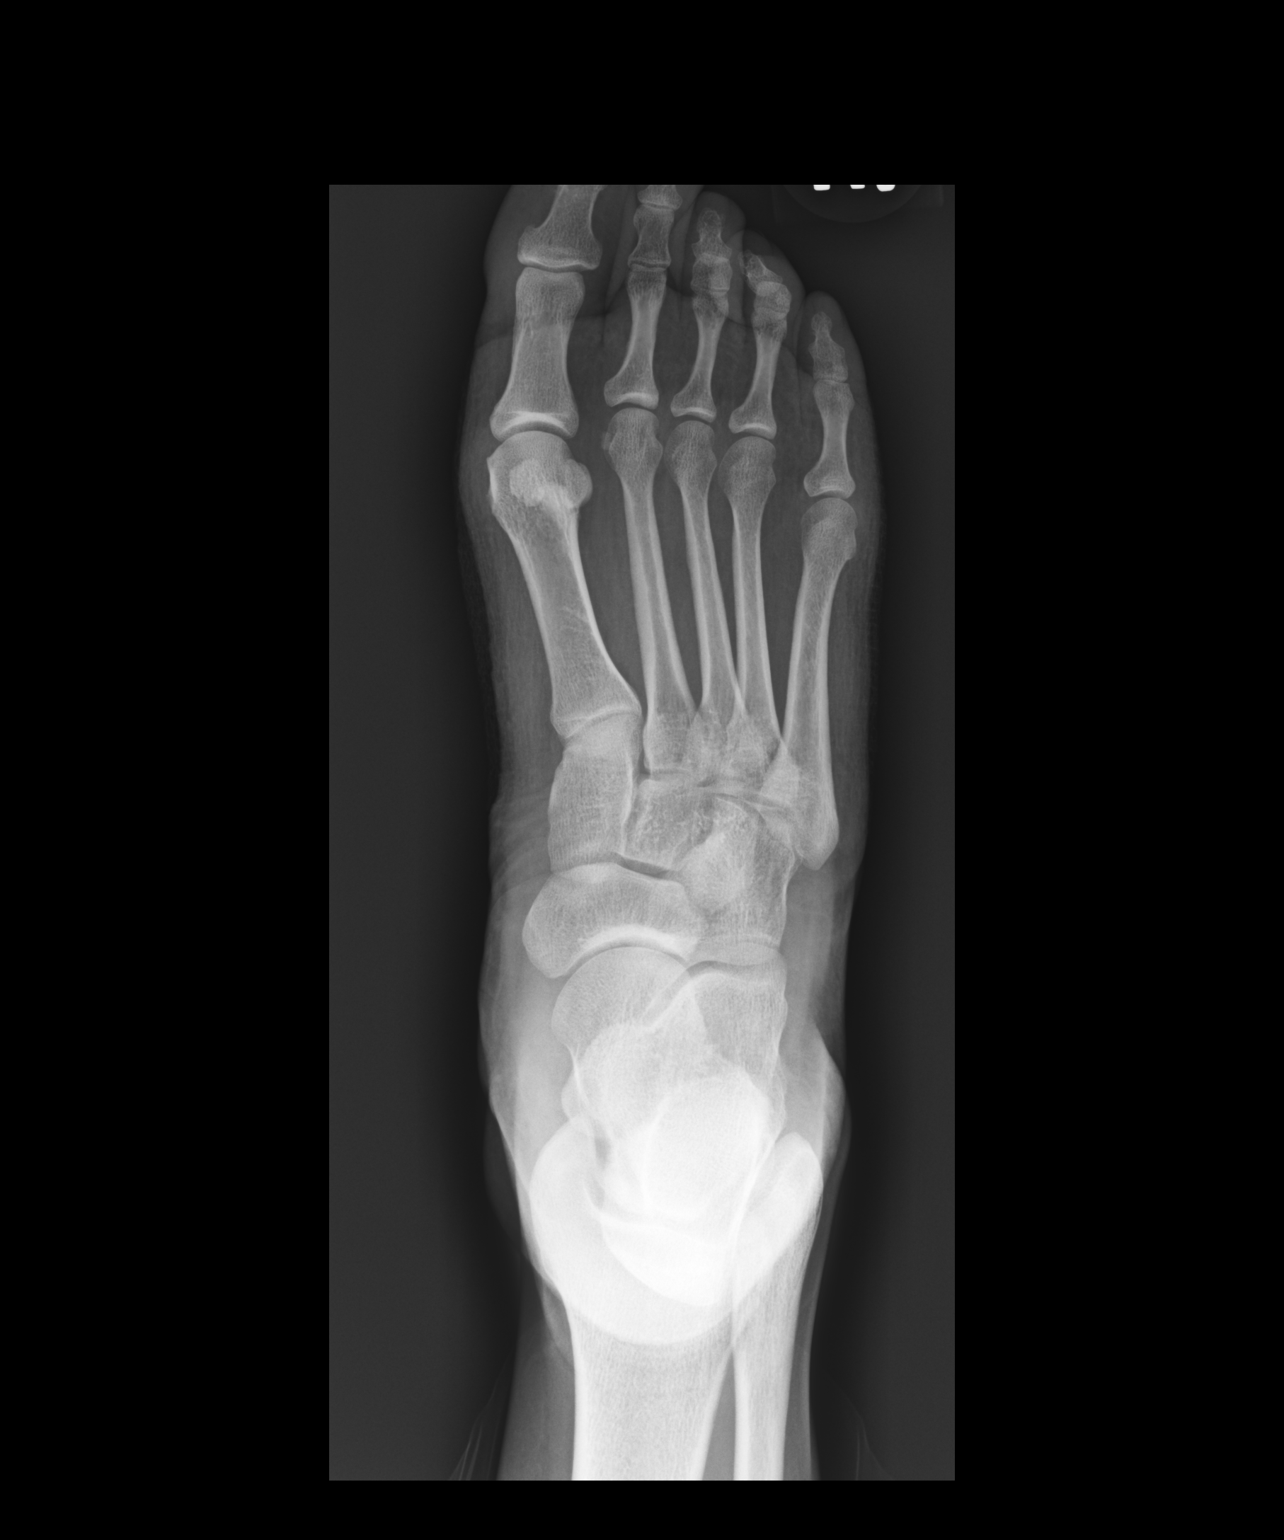

[dg foot complete right (2 of 3)]
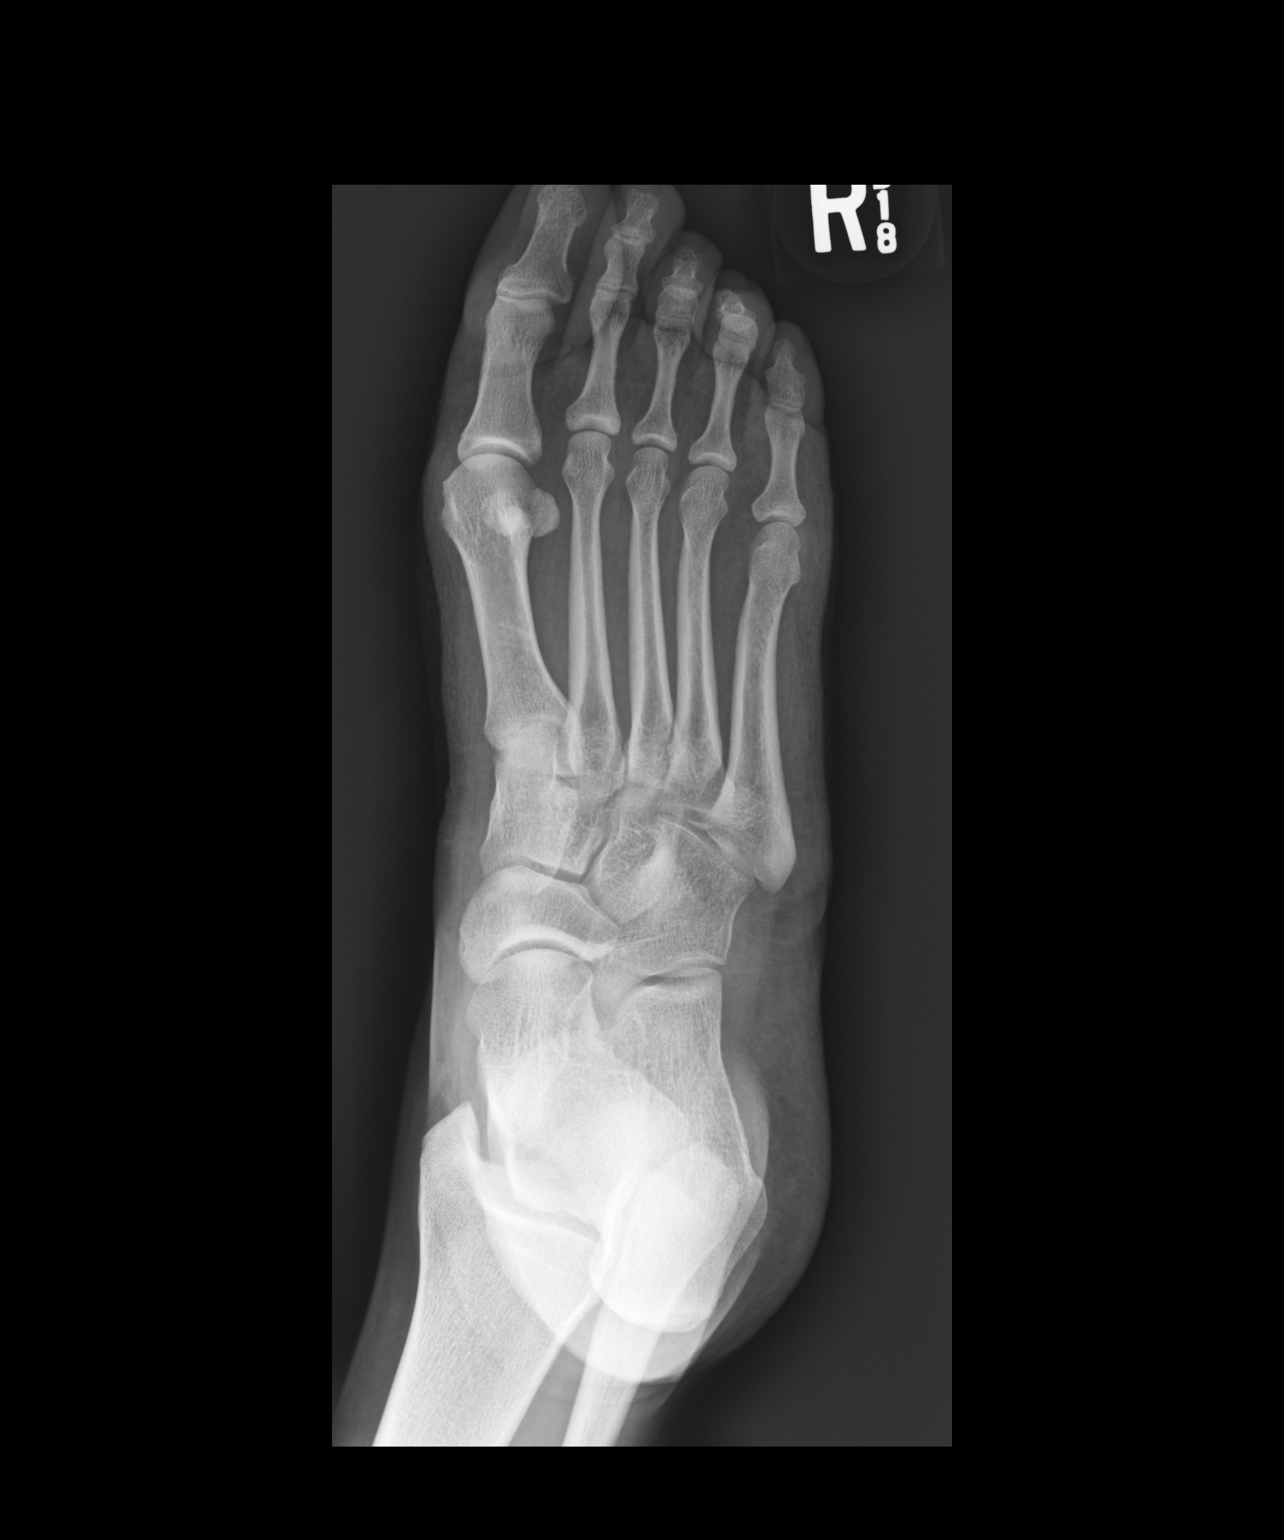

[dg foot complete right (3 of 3)]
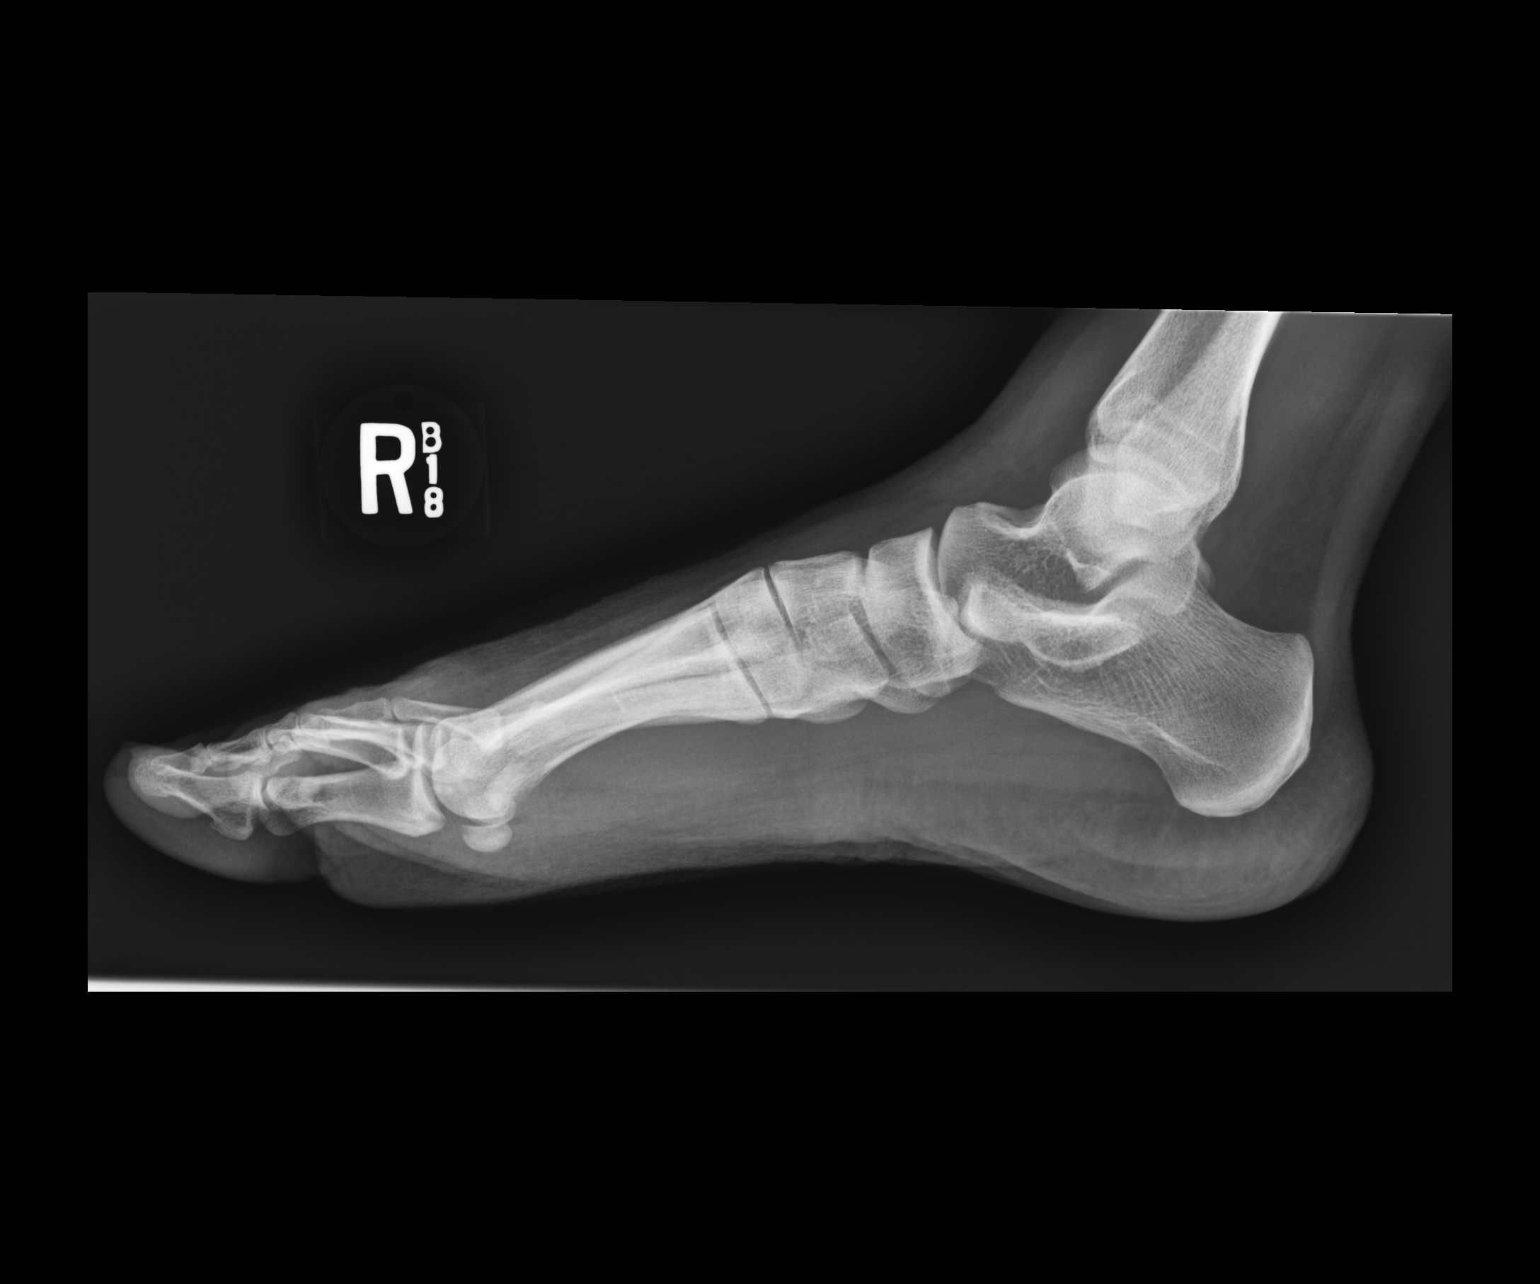

[3 of 3 positions shown; findings below may reference images not displayed]

FINDINGS: No acute bony or joint abnormality identified. No evidence of
fracture or dislocation.
IMPRESSION: No acute abnormality.

## 2019-12-24 ENCOUNTER — Ambulatory Visit: Payer: Self-pay | Admitting: Allergy

## 2020-02-17 ENCOUNTER — Other Ambulatory Visit: Payer: Self-pay | Admitting: Allergy & Immunology

## 2020-03-15 ENCOUNTER — Other Ambulatory Visit: Payer: Self-pay | Admitting: Allergy & Immunology

## 2020-04-22 ENCOUNTER — Other Ambulatory Visit: Payer: Self-pay | Admitting: Allergy & Immunology

## 2020-06-13 ENCOUNTER — Other Ambulatory Visit: Payer: Self-pay | Admitting: Allergy & Immunology

## 2020-08-24 ENCOUNTER — Other Ambulatory Visit: Payer: Self-pay | Admitting: Internal Medicine

## 2020-08-24 DIAGNOSIS — Z1231 Encounter for screening mammogram for malignant neoplasm of breast: Secondary | ICD-10-CM

## 2020-09-04 ENCOUNTER — Other Ambulatory Visit: Payer: Self-pay | Admitting: Allergy & Immunology

## 2020-09-11 ENCOUNTER — Other Ambulatory Visit: Payer: Self-pay | Admitting: Allergy & Immunology

## 2020-10-05 ENCOUNTER — Ambulatory Visit: Payer: Self-pay
